# Patient Record
Sex: Female | Born: 1970 | Race: White | Hispanic: No | Marital: Married | Smoking: Current every day smoker
Health system: Southern US, Community
[De-identification: ages and names within clinical notes are randomized; demographics above are authoritative.]

## PROBLEM LIST (undated history)

## (undated) DIAGNOSIS — R87619 Unspecified abnormal cytological findings in specimens from cervix uteri: Secondary | ICD-10-CM

## (undated) HISTORY — PX: CERVICAL BIOPSY  W/ LOOP ELECTRODE EXCISION: SUR135

## (undated) HISTORY — PX: DILATION AND CURETTAGE OF UTERUS: SHX78

## (undated) HISTORY — DX: Unspecified abnormal cytological findings in specimens from cervix uteri: R87.619

## (undated) HISTORY — PX: MOHS SURGERY: SUR867

## (undated) HISTORY — PX: KNEE ARTHROSCOPY: SUR90

## (undated) HISTORY — PX: CRYOTHERAPY: SHX1416

---

## 2005-10-25 ENCOUNTER — Ambulatory Visit: Payer: Self-pay | Admitting: Obstetrics & Gynecology

## 2006-07-04 ENCOUNTER — Observation Stay: Payer: Self-pay | Admitting: Obstetrics & Gynecology

## 2008-03-05 HISTORY — PX: BREAST BIOPSY: SHX20

## 2008-07-15 ENCOUNTER — Inpatient Hospital Stay: Payer: Self-pay

## 2011-04-12 ENCOUNTER — Emergency Department: Payer: Self-pay | Admitting: Emergency Medicine

## 2015-09-15 DIAGNOSIS — D0359 Melanoma in situ of other part of trunk: Secondary | ICD-10-CM | POA: Insufficient documentation

## 2017-01-31 ENCOUNTER — Encounter: Payer: Self-pay | Admitting: Advanced Practice Midwife

## 2017-01-31 ENCOUNTER — Ambulatory Visit (INDEPENDENT_AMBULATORY_CARE_PROVIDER_SITE_OTHER): Payer: BLUE CROSS/BLUE SHIELD | Admitting: Advanced Practice Midwife

## 2017-01-31 VITALS — BP 124/74 | Ht 71.0 in | Wt 220.0 lb

## 2017-01-31 DIAGNOSIS — Z124 Encounter for screening for malignant neoplasm of cervix: Secondary | ICD-10-CM | POA: Diagnosis not present

## 2017-01-31 DIAGNOSIS — M2390 Unspecified internal derangement of unspecified knee: Secondary | ICD-10-CM | POA: Insufficient documentation

## 2017-01-31 DIAGNOSIS — Z01419 Encounter for gynecological examination (general) (routine) without abnormal findings: Secondary | ICD-10-CM | POA: Diagnosis not present

## 2017-01-31 DIAGNOSIS — S8390XA Sprain of unspecified site of unspecified knee, initial encounter: Secondary | ICD-10-CM | POA: Insufficient documentation

## 2017-01-31 NOTE — Progress Notes (Addendum)
Patient ID: Sandra Harper, female   DOB: 23-Aug-1970, 46 y.o.   MRN: [id number]    Gynecology Annual Exam  PCP: Patient, No Pcp Per  Chief Complaint:  Chief Complaint  Patient presents with  . Annual Exam    History of Present Illness: Patient is a 46 y.o. [id number] presents for annual exam. The patient has no complaints today. She says it has been about a year since she has had a period. Discussion of normal menopausal changes. 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  LMP: No LMP recorded.  Postcoital Bleeding: no Dysmenorrhea: not applicable   The patient is sexually active. She currently uses vasectomy for contraception. She denies dyspareunia.  The patient does perform self breast exams.  There is no notable family history of breast or ovarian cancer in her family.  The patient wears seatbelts: yes.   The patient has regular exercise: yes.  She admits to eating healthy and being active but admits there is room for improvement. She has gained weight in the past year. She declines thyroid testing.  The patient denies current symptoms of depression.    Review of Systems: Review of Systems  Constitutional: Negative.   HENT: Negative.   Eyes: Negative.   Respiratory: Negative.   Cardiovascular: Negative.   Gastrointestinal: Negative.   Genitourinary: Negative.   Musculoskeletal: Negative.   Skin: Negative.   Neurological: Negative.   Endo/Heme/Allergies: Negative.   Psychiatric/Behavioral: Negative.     Past Medical History:  Past Medical History:  Diagnosis Date  . Abnormal Pap smear of cervix     Past Surgical History:  Past Surgical History:  Procedure Laterality Date  . CERVICAL BIOPSY  W/ LOOP ELECTRODE EXCISION    . CESAREAN SECTION    . CRYOTHERAPY    . DILATION AND CURETTAGE OF UTERUS      Gynecologic History:  No LMP recorded. Contraception: vasectomy Last Pap: 3 years ago Results were:  no  abnormalities  Last mammogram: 3 years ago Results were: BI-RAD I Obstetric History: [id number]  Family History:  History reviewed. No pertinent family history.  Social History:  Social History   Socioeconomic History  . Marital status: Married    Spouse name: Not on file  . Number of children: Not on file  . Years of education: Not on file  . Highest education level: Not on file  Social Needs  . Financial resource strain: Not on file  . Food insecurity - worry: Not on file  . Food insecurity - inability: Not on file  . Transportation needs - medical: Not on file  . Transportation needs - non-medical: Not on file  Occupational History  . Not on file  Tobacco Use  . Smoking status: Never Smoker  . Smokeless tobacco: Never Used  Substance and Sexual Activity  . Alcohol use: No    Frequency: Never  . Drug use: No  . Sexual activity: Yes    Birth control/protection: Surgical  Other Topics Concern  . Not on file  Social History Narrative  . Not on file    Allergies:  No Known Allergies  Medications: Prior to Admission medications   Not on File    Physical Exam Vitals: Blood pressure 124/74, height 5\' 11"  (1.803 m), weight 220 lb (99.8 kg).  General: NAD HEENT: normocephalic, anicteric Thyroid: no enlargement, no palpable nodules Pulmonary: No increased work of breathing, CTAB Cardiovascular: RRR, distal pulses 2+ Breast:  Breast symmetrical, no tenderness, no palpable nodules or masses, no skin or nipple retraction present, no nipple discharge.  No axillary or supraclavicular lymphadenopathy. Abdomen: NABS, soft, non-tender, non-distended.  Umbilicus without lesions.  No hepatomegaly, splenomegaly or masses palpable. No evidence of hernia  Genitourinary:  External: Normal external female genitalia.  Normal urethral meatus, normal  Bartholin's and Skene's glands.    Vagina: Normal vaginal mucosa, no evidence of prolapse.    Cervix: Grossly normal in appearance, no  bleeding  Uterus: Non-enlarged, mobile, normal contour.  No CMT  Adnexa: ovaries non-enlarged, no adnexal masses  Rectal: deferred  Lymphatic: no evidence of inguinal lymphadenopathy Extremities: no edema, erythema, or tenderness Neurologic: Grossly intact Psychiatric: mood appropriate, affect full    Assessment: 46 y.o. G2P2002 routine annual exam  Plan: Problem List Items Addressed This Visit    None    Visit Diagnoses    Well woman exam with routine gynecological exam    -  Primary   Relevant Orders   IGP, Aptima HPV   MM DIGITAL SCREENING BILATERAL   Ambulatory referral to Gastroenterology   Cervical cancer screening       Relevant Orders   IGP, Aptima HPV      1) Mammogram - recommend yearly screening mammogram.  Mammogram Was ordered today   2) STI screening was offered and declined  3) ASCCP guidelines and rational discussed.  Patient opts for every 3 years screening interval  4) Contraception - vasectomy  5) Colonoscopy: referral sent today-- Screening recommended starting at age 96 for average risk individuals, age 62 for individuals deemed at increased risk (including African Americans) and recommended to continue until age 73.  For patient age 73-85 individualized approach is recommended.  Gold standard screening is via colonoscopy, Cologuard screening is an acceptable alternative for patient unwilling or unable to undergo colonoscopy.  "Colorectal cancer screening for average?risk adults: 2018 guideline update from the [place]: A Cancer Journal for Clinicians: Aug 01, 2016   6) Routine healthcare maintenance including cholesterol, diabetes screening discussed Declines   7) Increase healthy lifestyle diet, exercise  8) Return to clinic in 1 year for annual exam  Rod Can, CNM

## 2017-01-31 NOTE — Patient Instructions (Signed)
Health Maintenance, Female Adopting a healthy lifestyle and getting preventive care can go a long way to promote health and wellness. Talk with your health care provider about what schedule of regular examinations is right for you. This is a good chance for you to check in with your provider about disease prevention and staying healthy. In between checkups, there are plenty of things you can do on your own. Experts have done a lot of research about which lifestyle changes and preventive measures are most likely to keep you healthy. Ask your health care provider for more information. Weight and diet Eat a healthy diet  Be sure to include plenty of vegetables, fruits, low-fat dairy products, and lean protein.  Do not eat a lot of foods high in solid fats, added sugars, or salt.  Get regular exercise. This is one of the most important things you can do for your health. ? Most adults should exercise for at least 150 minutes each week. The exercise should increase your heart rate and make you sweat (moderate-intensity exercise). ? Most adults should also do strengthening exercises at least twice a week. This is in addition to the moderate-intensity exercise.  Maintain a healthy weight  Body mass index (BMI) is a measurement that can be used to identify possible weight problems. It estimates body fat based on height and weight. Your health care provider can help determine your BMI and help you achieve or maintain a healthy weight.  For females 69 years of age and older: ? A BMI below 18.5 is considered underweight. ? A BMI of 18.5 to 24.9 is normal. ? A BMI of 25 to 29.9 is considered overweight. ? A BMI of 30 and above is considered obese.  Watch levels of cholesterol and blood lipids  You should start having your blood tested for lipids and cholesterol at 48 years of age, then have this test every 5 years.  You may need to have your cholesterol levels checked more often if: ? Your lipid or  cholesterol levels are high. ? You are older than 48 years of age. ? You are at high risk for heart disease.  Cancer screening Lung Cancer  Lung cancer screening is recommended for adults 70-27 years old who are at high risk for lung cancer because of a history of smoking.  A yearly low-dose CT scan of the lungs is recommended for people who: ? Currently smoke. ? Have quit within the past 15 years. ? Have at least a 30-pack-year history of smoking. A pack year is smoking an average of one pack of cigarettes a day for 1 year.  Yearly screening should continue until it has been 15 years since you quit.  Yearly screening should stop if you develop a health problem that would prevent you from having lung cancer treatment.  Breast Cancer  Practice breast self-awareness. This means understanding how your breasts normally appear and feel.  It also means doing regular breast self-exams. Let your health care provider know about any changes, no matter how small.  If you are in your 20s or 30s, you should have a clinical breast exam (CBE) by a health care provider every 1-3 years as part of a regular health exam.  If you are 68 or older, have a CBE every year. Also consider having a breast X-ray (mammogram) every year.  If you have a family history of breast cancer, talk to your health care provider about genetic screening.  If you are at high risk  for breast cancer, talk to your health care provider about having an MRI and a mammogram every year.  Breast cancer gene (BRCA) assessment is recommended for women who have family members with BRCA-related cancers. BRCA-related cancers include: ? Breast. ? Ovarian. ? Tubal. ? Peritoneal cancers.  Results of the assessment will determine the need for genetic counseling and BRCA1 and BRCA2 testing.  Cervical Cancer Your health care provider may recommend that you be screened regularly for cancer of the pelvic organs (ovaries, uterus, and  vagina). This screening involves a pelvic examination, including checking for microscopic changes to the surface of your cervix (Pap test). You may be encouraged to have this screening done every 3 years, beginning at age 22.  For women ages 56-65, health care providers may recommend pelvic exams and Pap testing every 3 years, or they may recommend the Pap and pelvic exam, combined with testing for human papilloma virus (HPV), every 5 years. Some types of HPV increase your risk of cervical cancer. Testing for HPV may also be done on women of any age with unclear Pap test results.  Other health care providers may not recommend any screening for nonpregnant women who are considered low risk for pelvic cancer and who do not have symptoms. Ask your health care provider if a screening pelvic exam is right for you.  If you have had past treatment for cervical cancer or a condition that could lead to cancer, you need Pap tests and screening for cancer for at least 20 years after your treatment. If Pap tests have been discontinued, your risk factors (such as having a new sexual partner) need to be reassessed to determine if screening should resume. Some women have medical problems that increase the chance of getting cervical cancer. In these cases, your health care provider may recommend more frequent screening and Pap tests.  Colorectal Cancer  This type of cancer can be detected and often prevented.  Routine colorectal cancer screening usually begins at 48 years of age and continues through 48 years of age.  Your health care provider may recommend screening at an earlier age if you have risk factors for colon cancer.  Your health care provider may also recommend using home test kits to check for hidden blood in the stool.  A small camera at the end of a tube can be used to examine your colon directly (sigmoidoscopy or colonoscopy). This is done to check for the earliest forms of colorectal  cancer.  Routine screening usually begins at age 33.  Direct examination of the colon should be repeated every 5-10 years through 48 years of age. However, you may need to be screened more often if early forms of precancerous polyps or small growths are found.  Skin Cancer  Check your skin from head to toe regularly.  Tell your health care provider about any new moles or changes in moles, especially if there is a change in a mole's shape or color.  Also tell your health care provider if you have a mole that is larger than the size of a pencil eraser.  Always use sunscreen. Apply sunscreen liberally and repeatedly throughout the day.  Protect yourself by wearing long sleeves, pants, a wide-brimmed hat, and sunglasses whenever you are outside.  Heart disease, diabetes, and high blood pressure  High blood pressure causes heart disease and increases the risk of stroke. High blood pressure is more likely to develop in: ? People who have blood pressure in the high end of  the normal range (130-139/85-89 mm Hg). ? People who are overweight or obese. ? People who are African American.  If you are 21-29 years of age, have your blood pressure checked every 3-5 years. If you are 3 years of age or older, have your blood pressure checked every year. You should have your blood pressure measured twice-once when you are at a hospital or clinic, and once when you are not at a hospital or clinic. Record the average of the two measurements. To check your blood pressure when you are not at a hospital or clinic, you can use: ? An automated blood pressure machine at a pharmacy. ? A home blood pressure monitor.  If you are between 17 years and 37 years old, ask your health care provider if you should take aspirin to prevent strokes.  Have regular diabetes screenings. This involves taking a blood sample to check your fasting blood sugar level. ? If you are at a normal weight and have a low risk for diabetes,  have this test once every three years after 48 years of age. ? If you are overweight and have a high risk for diabetes, consider being tested at a younger age or more often. Preventing infection Hepatitis B  If you have a higher risk for hepatitis B, you should be screened for this virus. You are considered at high risk for hepatitis B if: ? You were born in a country where hepatitis B is common. Ask your health care provider which countries are considered high risk. ? Your parents were born in a high-risk country, and you have not been immunized against hepatitis B (hepatitis B vaccine). ? You have HIV or AIDS. ? You use needles to inject street drugs. ? You live with someone who has hepatitis B. ? You have had sex with someone who has hepatitis B. ? You get hemodialysis treatment. ? You take certain medicines for conditions, including cancer, organ transplantation, and autoimmune conditions.  Hepatitis C  Blood testing is recommended for: ? Everyone born from [birth year] through 1967. ? Anyone with known risk factors for hepatitis C.  Sexually transmitted infections (STIs)  You should be screened for sexually transmitted infections (STIs) including gonorrhea and chlamydia if: ? You are sexually active and are younger than 48 years of age. ? You are older than 48 years of age and your health care provider tells you that you are at risk for this type of infection. ? Your sexual activity has changed since you were last screened and you are at an increased risk for chlamydia or gonorrhea. Ask your health care provider if you are at risk.  If you do not have HIV, but are at risk, it may be recommended that you take a prescription medicine daily to prevent HIV infection. This is called pre-exposure prophylaxis (PrEP). You are considered at risk if: ? You are sexually active and do not regularly use condoms or know the HIV status of your partner(s). ? You take drugs by injection. ? You are  sexually active with a partner who has HIV.  Talk with your health care provider about whether you are at high risk of being infected with HIV. If you choose to begin PrEP, you should first be tested for HIV. You should then be tested every 3 months for as long as you are taking PrEP. Pregnancy  If you are premenopausal and you may become pregnant, ask your health care provider about preconception counseling.  If you may become  pregnant, take 400 to 800 micrograms (mcg) of folic acid every day.  If you want to prevent pregnancy, talk to your health care provider about birth control (contraception). Osteoporosis and menopause  Osteoporosis is a disease in which the bones lose minerals and strength with aging. This can result in serious bone fractures. Your risk for osteoporosis can be identified using a bone density scan.  If you are 51 years of age or older, or if you are at risk for osteoporosis and fractures, ask your health care provider if you should be screened.  Ask your health care provider whether you should take a calcium or vitamin D supplement to lower your risk for osteoporosis.  Menopause may have certain physical symptoms and risks.  Hormone replacement therapy may reduce some of these symptoms and risks. Talk to your health care provider about whether hormone replacement therapy is right for you. Follow these instructions at home:  Schedule regular health, dental, and eye exams.  Stay current with your immunizations.  Do not use any tobacco products including cigarettes, chewing tobacco, or electronic cigarettes.  If you are pregnant, do not drink alcohol.  If you are breastfeeding, limit how much and how often you drink alcohol.  Limit alcohol intake to no more than 1 drink per day for nonpregnant women. One drink equals 12 ounces of beer, 5 ounces of wine, or 1 ounces of hard liquor.  Do not use street drugs.  Do not share needles.  Ask your health care  provider for help if you need support or information about quitting drugs.  Tell your health care provider if you often feel depressed.  Tell your health care provider if you have ever been abused or do not feel safe at home. This information is not intended to replace advice given to you by your health care provider. Make sure you discuss any questions you have with your health care provider. Document Released: 09/04/2010 Document Revised: 07/28/2015 Document Reviewed: 11/23/2014 Elsevier Interactive Patient Education  2018 [employer].   Colonoscopy, Adult A colonoscopy is an exam to look at the entire large intestine. During the exam, a lubricated, bendable tube is inserted into the anus and then passed into the rectum, colon, and other parts of the large intestine. A colonoscopy is often done as a part of normal colorectal screening or in response to certain symptoms, such as anemia, persistent diarrhea, abdominal pain, and blood in the stool. The exam can help screen for and diagnose medical problems, including:  Tumors.  Polyps.  Inflammation.  Areas of bleeding.  Tell a health care provider about:  Any allergies you have.  All medicines you are taking, including vitamins, herbs, eye drops, creams, and over-the-counter medicines.  Any problems you or family members have had with anesthetic medicines.  Any blood disorders you have.  Any surgeries you have had.  Any medical conditions you have.  Any problems you have had passing stool. What are the risks? Generally, this is a safe procedure. However, problems may occur, including:  Bleeding.  A tear in the intestine.  A reaction to medicines given during the exam.  Infection (rare).  What happens before the procedure? Eating and drinking restrictions Follow instructions from your health care provider about eating and drinking, which may include:  A few days before the procedure - follow a low-fiber diet.  Avoid nuts, seeds, dried fruit, raw fruits, and vegetables.  1-3 days before the procedure - follow a clear liquid diet. Drink only  clear liquids, such as clear broth or bouillon, black coffee or tea, clear juice, clear soft drinks or sports drinks, gelatin dessert, and popsicles. Avoid any liquids that contain red or purple dye.  On the day of the procedure - do not eat or drink anything during the 2 hours before the procedure, or within the time period that your health care provider recommends.  Bowel prep If you were prescribed an oral bowel prep to clean out your colon:  Take it as told by your health care provider. Starting the day before your procedure, you will need to drink a large amount of medicated liquid. The liquid will cause you to have multiple loose stools until your stool is almost clear or light green.  If your skin or anus gets irritated from diarrhea, you may use these to relieve the irritation: ? Medicated wipes, such as adult wet wipes with aloe and vitamin E. ? A skin soothing-product like petroleum jelly.  If you vomit while drinking the bowel prep, take a break for up to 60 minutes and then begin the bowel prep again. If vomiting continues and you cannot take the bowel prep without vomiting, call your health care provider.  General instructions  Ask your health care provider about changing or stopping your regular medicines. This is especially important if you are taking diabetes medicines or blood thinners.  Plan to have someone take you home from the hospital or clinic. What happens during the procedure?  An IV tube may be inserted into one of your veins.  You will be given medicine to help you relax (sedative).  To reduce your risk of infection: ? Your health care team will wash or sanitize their hands. ? Your anal area will be washed with soap.  You will be asked to lie on your side with your knees bent.  Your health care provider will lubricate a long,  thin, flexible tube. The tube will have a camera and a light on the end.  The tube will be inserted into your anus.  The tube will be gently eased through your rectum and colon.  Air will be delivered into your colon to keep it open. You may feel some pressure or cramping.  The camera will be used to take images during the procedure.  A small tissue sample may be removed from your body to be examined under a microscope (biopsy). If any potential problems are found, the tissue will be sent to a lab for testing.  If small polyps are found, your health care provider may remove them and have them checked for cancer cells.  The tube that was inserted into your anus will be slowly removed. The procedure may vary among health care providers and hospitals. What happens after the procedure?  Your blood pressure, heart rate, breathing rate, and blood oxygen level will be monitored until the medicines you were given have worn off.  Do not drive for 24 hours after the exam.  You may have a small amount of blood in your stool.  You may pass gas and have mild abdominal cramping or bloating due to the air that was used to inflate your colon during the exam.  It is up to you to get the results of your procedure. Ask your health care provider, or the department performing the procedure, when your results will be ready. This information is not intended to replace advice given to you by your health care provider. Make sure you discuss any questions  you have with your health care provider. Document Released: 02/17/2000 Document Revised: 12/21/2015 Document Reviewed: 05/03/2015 Elsevier Interactive Patient Education  2018 [employer].     Why follow it? Research shows. . Those who follow the Mediterranean diet have a reduced risk of heart disease  . The diet is associated with a reduced incidence of Parkinson's and Alzheimer's diseases . People following the diet may have longer life expectancies and  lower rates of chronic diseases  . The Dietary Guidelines for Americans recommends the Mediterranean diet as an eating plan to promote health and prevent disease  What Is the Mediterranean Diet?  . Healthy eating plan based on typical foods and recipes of Mediterranean-style cooking . The diet is primarily a plant based diet; these foods should make up a majority of meals   Starches - Plant based foods should make up a majority of meals - They are an important sources of vitamins, minerals, energy, antioxidants, and fiber - Choose whole grains, foods high in fiber and minimally processed items  - Typical grain sources include wheat, oats, barley, corn, brown rice, bulgar, farro, millet, polenta, couscous  - Various types of beans include chickpeas, lentils, fava beans, black beans, white beans   Fruits  Veggies - Large quantities of antioxidant rich fruits & veggies; 6 or more servings  - Vegetables can be eaten raw or lightly drizzled with oil and cooked  - Vegetables common to the traditional Mediterranean Diet include: artichokes, arugula, beets, broccoli, brussel sprouts, cabbage, carrots, celery, collard greens, cucumbers, eggplant, kale, leeks, lemons, lettuce, mushrooms, okra, onions, peas, peppers, potatoes, pumpkin, radishes, rutabaga, shallots, spinach, sweet potatoes, turnips, zucchini - Fruits common to the Mediterranean Diet include: apples, apricots, avocados, cherries, clementines, dates, figs, grapefruits, grapes, melons, nectarines, oranges, peaches, pears, pomegranates, strawberries, tangerines  Fats - Replace butter and margarine with healthy oils, such as olive oil, canola oil, and tahini  - Limit nuts to no more than a handful a day  - Nuts include walnuts, almonds, pecans, pistachios, pine nuts  - Limit or avoid candied, honey roasted or heavily salted nuts - Olives are central to the [employer] - can be eaten whole or used in a variety of dishes   Meats Protein -  Limiting red meat: no more than a few times a month - When eating red meat: choose lean cuts and keep the portion to the size of deck of cards - Eggs: approx. 0 to 4 times a week  - Fish and lean poultry: at least 2 a week  - Healthy protein sources include, chicken, [place], lean beef, lamb - Increase intake of seafood such as tuna, salmon, trout, mackerel, shrimp, scallops - Avoid or limit high fat processed meats such as sausage and bacon  Dairy - Include moderate amounts of low fat dairy products  - Focus on healthy dairy such as fat free yogurt, skim milk, low or reduced fat cheese - Limit dairy products higher in fat such as whole or 2% milk, cheese, ice cream  Alcohol - Moderate amounts of red wine is ok  - No more than 5 oz daily for women (all ages) and men older than age 58  - No more than 10 oz of wine daily for men younger than 41  Other - Limit sweets and other desserts  - Use herbs and spices instead of salt to flavor foods  - Herbs and spices common to the traditional Mediterranean Diet include: basil, bay leaves, chives, cloves, cumin, fennel,  garlic, lavender, marjoram, mint, oregano, parsley, pepper, rosemary, sage, savory, sumac, tarragon, thyme   It's not just a diet, it's a lifestyle:  . The Mediterranean diet includes lifestyle factors typical of those in the region  . Foods, drinks and meals are best eaten with others and savored . Daily physical activity is important for overall good health . This could be strenuous exercise like running and aerobics . This could also be more leisurely activities such as walking, housework, yard-work, or taking the stairs . Moderation is the key; a balanced and healthy diet accommodates most foods and drinks . Consider portion sizes and frequency of consumption of certain foods   Meal Ideas & Options:  . Breakfast:  o Whole wheat toast or whole wheat English muffins with peanut butter & hard boiled egg o Steel cut oats topped with  apples & cinnamon and skim milk  o Fresh fruit: banana, strawberries, melon, berries, peaches  o Smoothies: strawberries, bananas, greek yogurt, peanut butter o Low fat greek yogurt with blueberries and granola  o Egg white omelet with spinach and mushrooms o Breakfast couscous: whole wheat couscous, apricots, skim milk, cranberries  . Sandwiches:  o Hummus and grilled vegetables (peppers, zucchini, squash) on whole wheat bread   o Grilled chicken on whole wheat pita with lettuce, tomatoes, cucumbers or tzatziki  o Tuna salad on whole wheat bread: tuna salad made with greek yogurt, olives, red peppers, capers, green onions o Garlic rosemary lamb pita: lamb sauted with garlic, rosemary, salt & pepper; add lettuce, cucumber, greek yogurt to pita - flavor with lemon juice and black pepper  . Seafood:  o Mediterranean grilled salmon, seasoned with garlic, basil, parsley, lemon juice and black pepper o Shrimp, lemon, and spinach whole-grain pasta salad made with low fat greek yogurt  o Seared scallops with lemon orzo  o Seared tuna steaks seasoned salt, pepper, coriander topped with tomato mixture of olives, tomatoes, olive oil, minced garlic, parsley, green onions and cappers  . Meats:  o Herbed greek chicken salad with kalamata olives, cucumber, feta  o Red bell peppers stuffed with spinach, bulgur, lean ground beef (or lentils) & topped with feta   o Kebabs: skewers of chicken, tomatoes, onions, zucchini, squash  o Turkey burgers: made with red onions, mint, dill, lemon juice, feta cheese topped with roasted red peppers . Vegetarian o Cucumber salad: cucumbers, artichoke hearts, celery, red onion, feta cheese, tossed in olive oil & lemon juice  o Hummus and whole grain pita points with a greek salad (lettuce, tomato, feta, olives, cucumbers, red onion) o Lentil soup with celery, carrots made with vegetable broth, garlic, salt and pepper  o Tabouli salad: parsley, bulgur, mint, scallions,  cucumbers, tomato, radishes, lemon juice, olive oil, salt and pepper.      American Heart Association (AHA) Exercise Recommendation  Being physically active is important to prevent heart disease and stroke, the nation's No. 1and No. 5killers. To improve overall cardiovascular health, we suggest at least 150 minutes per week of moderate exercise or 75 minutes per week of vigorous exercise (or a combination of moderate and vigorous activity). Thirty minutes a day, five times a week is an easy goal to remember. You will also experience benefits even if you divide your time into two or three segments of 10 to 15 minutes per day.  For people who would benefit from lowering their blood pressure or cholesterol, we recommend 40 minutes of aerobic exercise of moderate to vigorous intensity three to   four times a week to lower the risk for heart attack and stroke.  Physical activity is anything that makes you move your body and burn calories.  This includes things like climbing stairs or playing sports. Aerobic exercises benefit your heart, and include walking, jogging, swimming or biking. Strength and stretching exercises are best for overall stamina and flexibility.  The simplest, positive change you can make to effectively improve your heart health is to start walking. It's enjoyable, free, easy, social and great exercise. A walking program is flexible and boasts high success rates because people can stick with it. It's easy for walking to become a regular and satisfying part of life.   For Overall Cardiovascular Health:  At least 30 minutes of moderate-intensity aerobic activity at least 5 days per week for a total of 150  OR   At least 25 minutes of vigorous aerobic activity at least 3 days per week for a total of 75 minutes; or a combination of moderate- and vigorous-intensity aerobic activity  AND   Moderate- to high-intensity muscle-strengthening activity at least 2 days per week for  additional health benefits.  For Lowering Blood Pressure and Cholesterol  An average 40 minutes of moderate- to vigorous-intensity aerobic activity 3 or 4 times per week  What if I can't make it to the time goal? Something is always better than nothing! And everyone has to start somewhere. Even if you've been sedentary for years, today is the day you can begin to make healthy changes in your life. If you don't think you'll make it for 30 or 40 minutes, set a reachable goal for today. You can work up toward your overall goal by increasing your time as you get stronger. Don't let all-or-nothing thinking rob you of doing what you can every day.  Source:http://www.heart.org

## 2017-02-04 LAB — IGP, APTIMA HPV
HPV APTIMA: NEGATIVE
PAP Smear Comment: 0

## 2017-02-05 ENCOUNTER — Telehealth: Payer: Self-pay | Admitting: Gastroenterology

## 2017-02-05 NOTE — Telephone Encounter (Signed)
Patient Sandra Harper and would like to schedule a colonoscopy on 12/14.

## 2017-02-06 NOTE — Telephone Encounter (Signed)
LVM for pt to return my call.

## 2017-02-07 ENCOUNTER — Telehealth: Payer: Self-pay | Admitting: Gastroenterology

## 2017-02-07 NOTE — Telephone Encounter (Signed)
Patient left a voice message that she wanted that appt on the 14th. She will be in [place] all day but call her tomorrow

## 2017-02-13 ENCOUNTER — Telehealth: Payer: Self-pay | Admitting: Gastroenterology

## 2017-02-13 ENCOUNTER — Other Ambulatory Visit: Payer: Self-pay

## 2017-02-13 DIAGNOSIS — Z1211 Encounter for screening for malignant neoplasm of colon: Secondary | ICD-10-CM

## 2017-02-13 MED ORDER — PEG 3350-KCL-NA BICARB-NACL 420 G PO SOLR: 4000.0000 mL | Freq: Once | ORAL | 0 refills | Status: AC

## 2017-02-13 NOTE — Telephone Encounter (Signed)
Patient left a voice message that you called and she hasn't heard back from you. She wants her colonoscopy this Friday at [facility]. Please call her today.

## 2017-02-15 ENCOUNTER — Ambulatory Visit
Admission: RE | Admit: 2017-02-15 | Discharge: 2017-02-15 | Disposition: A | Discharge status: Home / Self Care | Payer: BLUE CROSS/BLUE SHIELD | Source: Ambulatory Visit | Attending: Gastroenterology | Admitting: Gastroenterology

## 2017-02-15 ENCOUNTER — Encounter: Payer: Self-pay | Admitting: *Deleted

## 2017-02-15 ENCOUNTER — Ambulatory Visit: Payer: BLUE CROSS/BLUE SHIELD | Admitting: Certified Registered Nurse Anesthetist

## 2017-02-15 ENCOUNTER — Encounter
Admission: RE | Disposition: A | Discharge status: Home / Self Care | Payer: Self-pay | Source: Ambulatory Visit | Attending: Gastroenterology

## 2017-02-15 DIAGNOSIS — K635 Polyp of colon: Secondary | ICD-10-CM | POA: Insufficient documentation

## 2017-02-15 DIAGNOSIS — Z1211 Encounter for screening for malignant neoplasm of colon: Secondary | ICD-10-CM

## 2017-02-15 DIAGNOSIS — K5730 Diverticulosis of large intestine without perforation or abscess without bleeding: Secondary | ICD-10-CM | POA: Diagnosis not present

## 2017-02-15 DIAGNOSIS — D124 Benign neoplasm of descending colon: Secondary | ICD-10-CM

## 2017-02-15 DIAGNOSIS — K3189 Other diseases of stomach and duodenum: Secondary | ICD-10-CM | POA: Diagnosis not present

## 2017-02-15 HISTORY — PX: COLONOSCOPY WITH PROPOFOL: SHX5780

## 2017-02-15 LAB — POCT PREGNANCY, URINE: PREG TEST UR: NEGATIVE

## 2017-02-15 SURGERY — COLONOSCOPY WITH PROPOFOL
Anesthesia: General

## 2017-02-15 MED ORDER — PROPOFOL 500 MG/50ML IV EMUL
INTRAVENOUS | Status: AC
  Filled 2017-02-15: qty 50

## 2017-02-15 MED ORDER — PROPOFOL 10 MG/ML IV BOLUS
INTRAVENOUS | Status: DC | PRN
  Administered 2017-02-15 (×2): 20 mg via INTRAVENOUS
  Administered 2017-02-15: 60 mg via INTRAVENOUS
  Administered 2017-02-15: 20 mg via INTRAVENOUS

## 2017-02-15 MED ORDER — PROPOFOL 500 MG/50ML IV EMUL
INTRAVENOUS | Status: DC | PRN
  Administered 2017-02-15: 140 ug/kg/min via INTRAVENOUS

## 2017-02-15 MED ORDER — SODIUM CHLORIDE 0.9 % IV SOLN
INTRAVENOUS | Status: DC
  Administered 2017-02-15: 13:00:00 via INTRAVENOUS

## 2017-02-15 NOTE — Op Note (Addendum)
[facility]astroenterology Patient Name: Sandra Harper Procedure Date: 02/15/2017 1:15 PM MRN: [id number] Account #: [id number] Date of Birth: March 14, 1970 Admit Type: Outpatient Age: 46 Room: [facility] ENDO ROOM 4 Gender: Female Note Status: Finalized Procedure:            Colonoscopy Indications:          Screening for colorectal malignant neoplasm Providers:            Varnita B. Bonna Gains MD, MD Referring MD:         Forest Gleason Md, MD (Referring MD) Medicines:            Monitored Anesthesia Care Complications:        No immediate complications. Procedure:            Pre-Anesthesia Assessment:                       - ASA Grade Assessment: I - A normal, healthy patient.                       - Prior to the procedure, a History and Physical was                        performed, and patient medications, allergies and                        sensitivities were reviewed. The patient's tolerance of                        previous anesthesia was reviewed.                       - The risks and benefits of the procedure and the                        sedation options and risks were discussed with the                        patient. All questions were answered and informed                        consent was obtained.                       - Patient identification and proposed procedure were                        verified prior to the procedure by the physician, the                        nurse, the anesthesiologist, the anesthetist and the                        technician. The procedure was verified in the procedure                        room.                       After obtaining informed consent, the colonoscope was  passed under direct vision. Throughout the procedure,                        the patient's blood pressure, pulse, and oxygen                        saturations were monitored continuously. The                        Colonoscope was  introduced through the anus and                        advanced to the the cecum, identified by appendiceal                        orifice and ileocecal valve. The colonoscopy was                        performed with ease. The patient tolerated the                        procedure well. The quality of the bowel preparation                        was fair. Findings:      The perianal and digital rectal examinations were normal.      Two sessile polyps were found in the sigmoid colon and descending colon.       The polyps were 3 to 4 mm in size. These polyps were removed with a cold       biopsy forceps. Resection and retrieval were complete.      Multiple small and large-mouthed diverticula were found in the sigmoid       colon.      A localized area of mildly erythematous mucosa was found in the sigmoid       colon. 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 Large lesions were ruled       out. Small or flat lesions could have been missed due to the prep.      The exam was otherwise without abnormality.      The retroflexed view of the distal rectum and anal verge was normal and       showed no anal or rectal abnormalities. Impression:           - Preparation of the colon was fair.                       - Two 3 to 4 mm polyps in the sigmoid colon and in the                        descending colon, removed with a cold biopsy forceps.                        Resected and retrieved.                       - Diverticulosis in the sigmoid colon.                       -  Erythematous mucosa in the sigmoid colon.                       - The examination was otherwise normal.                       - The distal rectum and anal verge are normal on                        retroflexion view. Recommendation:       - Discharge patient to home (with escort).                       - Advance diet as tolerated.                        - Continue present medications.                       - Await pathology results.                       - Repeat colonoscopy in 2 years for surveillance with 2                        day prep due to fair prep on this exam.                       - The findings and recommendations were discussed with                        the patient.                       - The findings and recommendations were discussed with                        the patient's family.                       - Return to primary care physician as previously                        scheduled.                       - High fiber diet. Procedure Code(s):    --- Professional ---                       [phone], Colonoscopy, flexible; with biopsy, single or                        multiple Diagnosis Code(s):    --- Professional ---                       Z12.11, Encounter for screening for malignant neoplasm                        of colon                       D12.5, Benign neoplasm of sigmoid colon  D12.4, Benign neoplasm of descending colon                       K63.89, Other specified diseases of intestine                       K57.30, Diverticulosis of large intestine without                        perforation or abscess without bleeding CPT copyright 2016 American Medical Association. All rights reserved. The codes documented in this report are preliminary and upon coder review may  be revised to meet current compliance requirements.  Vonda Antigua, MD Margretta Sidle B. Bonna Gains MD, MD 02/15/2017 2:06:43 PM This report has been signed electronically. Number of Addenda: 0 Note Initiated On: 02/15/2017 1:15 PM Scope Withdrawal Time: 0 hours 20 minutes 29 seconds  Total Procedure Duration: 0 hours 34 minutes 58 seconds  Estimated Blood Loss: Estimated blood loss: none.      [facility]

## 2017-02-15 NOTE — Anesthesia Preprocedure Evaluation (Signed)
Anesthesia Evaluation  Patient identified by MRN, date of birth, ID band Patient awake    Reviewed: Allergy & Precautions, NPO status , Patient's Chart, lab work & pertinent test results  History of Anesthesia Complications Negative for: history of anesthetic complications  Airway Mallampati: II  TM Distance: >3 FB Neck ROM: Full    Dental no notable dental hx.    Pulmonary neg pulmonary ROS, neg sleep apnea, neg COPD,    breath sounds clear to auscultation- rhonchi (-) wheezing      Cardiovascular Exercise Tolerance: Good (-) hypertension(-) CAD, (-) Past MI and (-) Cardiac Stents  Rhythm:Regular Rate:Normal - Systolic murmurs and - Diastolic murmurs    Neuro/Psych negative neurological ROS  negative psych ROS   GI/Hepatic negative GI ROS, Neg liver ROS,   Endo/Other  negative endocrine ROSneg diabetes  Renal/GU negative Renal ROS     Musculoskeletal negative musculoskeletal ROS (+)   Abdominal (+) - obese,   Peds  Hematology negative hematology ROS (+)   Anesthesia Other Findings Past Medical History: No date: Abnormal Pap smear of cervix   Reproductive/Obstetrics                             Anesthesia Physical Anesthesia Plan  ASA: I  Anesthesia Plan: General   Post-op Pain Management:    Induction: Intravenous  PONV Risk Score and Plan: 2 and Propofol infusion  Airway Management Planned: Natural Airway  Additional Equipment:   Intra-op Plan:   Post-operative Plan:   Informed Consent: I have reviewed the patients History and Physical, chart, labs and discussed the procedure including the risks, benefits and alternatives for the proposed anesthesia with the patient or authorized representative who has indicated his/her understanding and acceptance.   Dental advisory given  Plan Discussed with: CRNA and Anesthesiologist  Anesthesia Plan Comments:          Anesthesia Quick Evaluation

## 2017-02-15 NOTE — Transfer of Care (Signed)
Immediate Anesthesia Transfer of Care Note  Patient: Sandra Harper  Procedure(s) Performed: COLONOSCOPY WITH PROPOFOL (N/A )  Patient Location: PACU  Anesthesia Type:General  Level of Consciousness: awake, alert  and oriented  Airway & Oxygen Therapy: Patient Spontanous Breathing and Patient connected to nasal cannula oxygen  Post-op Assessment: Report given to RN and Post -op Vital signs reviewed and stable  Post vital signs: Reviewed and stable  Last Vitals:  Vitals:   02/15/17 1238  BP: 122/76  Pulse: 77  Resp: 16  Temp: 36.4 C  SpO2: 100%    Last Pain:  Vitals:   02/15/17 1238  TempSrc: Tympanic         Complications: No apparent anesthesia complications

## 2017-02-15 NOTE — Anesthesia Procedure Notes (Signed)
Performed by: Cobain Morici, CRNA Pre-anesthesia Checklist: Patient identified, Emergency Drugs available, Suction available, Patient being monitored and Timeout performed Patient Re-evaluated:Patient Re-evaluated prior to induction Oxygen Delivery Method: Nasal cannula Induction Type: IV induction       

## 2017-02-15 NOTE — H&P (Signed)
  Vonda Antigua, MD [address], [place], [place], [place], [address] [id number], [place], [place], [place], [address] Phone: [phone]  Fax: [phone]  Primary Care Physician:  Patient, No Pcp Per   Pre-Procedure History & Physical: HPI:  Sandra Harper is a 46 y.o. female is here for an colonoscopy.   Past Medical History:  Diagnosis Date  . Abnormal Pap smear of cervix     Past Surgical History:  Procedure Laterality Date  . CERVICAL BIOPSY  W/ LOOP ELECTRODE EXCISION    . CESAREAN SECTION    . CRYOTHERAPY    . DILATION AND CURETTAGE OF UTERUS    . MOHS SURGERY     laser removal of melanoma right shoulder    Prior to Admission medications   Not on File    Allergies as of 02/14/2017  . (No Known Allergies)    History reviewed. No pertinent family history.  Social History   Socioeconomic History  . Marital status: Married    Spouse name: Not on file  . Number of children: Not on file  . Years of education: Not on file  . Highest education level: Not on file  Social Needs  . Financial resource strain: Not on file  . Food insecurity - worry: Not on file  . Food insecurity - inability: Not on file  . Transportation needs - medical: Not on file  . Transportation needs - non-medical: Not on file  Occupational History  . Not on file  Tobacco Use  . Smoking status: Never Smoker  . Smokeless tobacco: Never Used  Substance and Sexual Activity  . Alcohol use: No    Frequency: Never  . Drug use: No  . Sexual activity: Yes    Birth control/protection: Surgical  Other Topics Concern  . Not on file  Social History Narrative  . Not on file    Review of Systems: See HPI, otherwise negative ROS  Physical Exam: BP 122/76   Pulse 77   Temp 97.6 F (36.4 C) (Tympanic)   Resp 16   Ht 5\' 11"  (1.803 m)   Wt 220 lb (99.8 kg)   LMP 02/16/2016 (Approximate)   SpO2 100%   BMI 30.68 kg/m  General:   Alert,  pleasant and cooperative in NAD Head:   Normocephalic and atraumatic. Neck:  Supple; no masses or thyromegaly. Lungs:  Clear throughout to auscultation, normal respiratory effort.    Heart:  +S1, +S2, Regular rate and rhythm, No edema. Abdomen:  Soft, nontender and nondistended. Normal bowel sounds, without guarding, and without rebound.   Neurologic:  Alert and  oriented x4;  grossly normal neurologically.  Impression/Plan: Sandra Harper is here for an colonoscopy to be performed for surveillance due to [facility] screening  Risks, benefits, limitations, and alternatives regarding  colonoscopy have been reviewed with the patient.  Questions have been answered.  All parties agreeable.   Virgel Manifold, MD  02/15/2017, 1:14 PM

## 2017-02-15 NOTE — Anesthesia Postprocedure Evaluation (Signed)
Anesthesia Post Note  Patient: Sandra Harper  Procedure(s) Performed: COLONOSCOPY WITH PROPOFOL (N/A )  Patient location during evaluation: Endoscopy Anesthesia Type: General Level of consciousness: awake and alert and oriented Pain management: pain level controlled Vital Signs Assessment: post-procedure vital signs reviewed and stable Respiratory status: spontaneous breathing, nonlabored ventilation and respiratory function stable Cardiovascular status: blood pressure returned to baseline and stable Postop Assessment: no signs of nausea or vomiting Anesthetic complications: no     Last Vitals:  Vitals:   02/15/17 1238 02/15/17 1403  BP: 122/76   Pulse: 77   Resp: 16   Temp: 36.4 C (!) 35.8 C  SpO2: 100%     Last Pain:  Vitals:   02/15/17 1403  TempSrc: Tympanic                 Orlo Brickle

## 2017-02-15 NOTE — Anesthesia Post-op Follow-up Note (Signed)
Anesthesia QCDR form completed.        

## 2017-02-18 ENCOUNTER — Encounter: Payer: Self-pay | Admitting: Gastroenterology

## 2017-02-18 LAB — SURGICAL PATHOLOGY

## 2017-02-19 ENCOUNTER — Encounter: Payer: Self-pay | Admitting: Gastroenterology

## 2017-04-13 ENCOUNTER — Emergency Department: Payer: BLUE CROSS/BLUE SHIELD

## 2017-04-13 ENCOUNTER — Encounter: Payer: Self-pay | Admitting: Emergency Medicine

## 2017-04-13 DIAGNOSIS — Y929 Unspecified place or not applicable: Secondary | ICD-10-CM | POA: Diagnosis not present

## 2017-04-13 DIAGNOSIS — S0990XA Unspecified injury of head, initial encounter: Secondary | ICD-10-CM | POA: Diagnosis present

## 2017-04-13 DIAGNOSIS — F17200 Nicotine dependence, unspecified, uncomplicated: Secondary | ICD-10-CM | POA: Diagnosis not present

## 2017-04-13 DIAGNOSIS — Y939 Activity, unspecified: Secondary | ICD-10-CM | POA: Diagnosis not present

## 2017-04-13 DIAGNOSIS — Y998 Other external cause status: Secondary | ICD-10-CM | POA: Insufficient documentation

## 2017-04-13 DIAGNOSIS — S060X0A Concussion without loss of consciousness, initial encounter: Secondary | ICD-10-CM | POA: Diagnosis not present

## 2017-04-13 DIAGNOSIS — W2113XA Struck by golf club, initial encounter: Secondary | ICD-10-CM | POA: Diagnosis not present
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
## 2017-04-14 ENCOUNTER — Emergency Department
Admission: EM | Admit: 2017-04-14 | Discharge: 2017-04-14 | Disposition: A | Discharge status: Home / Self Care | Payer: BLUE CROSS/BLUE SHIELD | Attending: Emergency Medicine | Admitting: Emergency Medicine

## 2017-04-14 DIAGNOSIS — S060X0A Concussion without loss of consciousness, initial encounter: Secondary | ICD-10-CM

## 2017-04-14 MED ORDER — IBUPROFEN 600 MG PO TABS
600.0000 mg | ORAL_TABLET | Freq: Once | ORAL | Status: AC
  Administered 2017-04-14: 600 mg via ORAL

## 2017-04-14 MED ORDER — IBUPROFEN 600 MG PO TABS
ORAL_TABLET | ORAL | Status: AC
  Filled 2017-04-14: qty 1
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
## 2018-01-15 ENCOUNTER — Telehealth: Payer: Self-pay

## 2018-01-15 NOTE — Telephone Encounter (Signed)
Pt has a annual schedule for 12/4, she needs mammo orders in so she can schedule her mammogram

## 2018-01-20 ENCOUNTER — Other Ambulatory Visit: Payer: Self-pay | Admitting: Advanced Practice Midwife

## 2018-01-20 DIAGNOSIS — Z1239 Encounter for other screening for malignant neoplasm of breast: Secondary | ICD-10-CM

## 2018-01-20 NOTE — Progress Notes (Signed)
Referral sent for screening mammogram per patient request.

## 2018-01-20 NOTE — Telephone Encounter (Signed)
Sent referral for patient to have screening mammogram- she would like to try and schedule the same day as her annual exam here.

## 2018-02-05 ENCOUNTER — Ambulatory Visit (INDEPENDENT_AMBULATORY_CARE_PROVIDER_SITE_OTHER): Payer: BLUE CROSS/BLUE SHIELD | Admitting: Advanced Practice Midwife

## 2018-02-05 ENCOUNTER — Encounter: Payer: Self-pay | Admitting: Advanced Practice Midwife

## 2018-02-05 ENCOUNTER — Ambulatory Visit
Admission: RE | Admit: 2018-02-05 | Discharge: 2018-02-05 | Disposition: A | Discharge status: Home / Self Care | Payer: BLUE CROSS/BLUE SHIELD | Source: Ambulatory Visit | Attending: Advanced Practice Midwife | Admitting: Advanced Practice Midwife

## 2018-02-05 VITALS — BP 120/80 | Ht 70.0 in | Wt 218.0 lb

## 2018-02-05 DIAGNOSIS — Z1239 Encounter for other screening for malignant neoplasm of breast: Secondary | ICD-10-CM | POA: Diagnosis not present

## 2018-02-05 DIAGNOSIS — Z0000 Encounter for general adult medical examination without abnormal findings: Secondary | ICD-10-CM

## 2018-02-05 DIAGNOSIS — Z01419 Encounter for gynecological examination (general) (routine) without abnormal findings: Secondary | ICD-10-CM | POA: Diagnosis not present

## 2018-02-05 NOTE — Patient Instructions (Signed)
Preventive Care 40-64 Years, Female Preventive care refers to lifestyle choices and visits with your health care provider that can promote health and wellness. What does preventive care include?  A yearly physical exam. This is also called an annual well check.  Dental exams once or twice a year.  Routine eye exams. Ask your health care provider how often you should have your eyes checked.  Personal lifestyle choices, including: ? Daily care of your teeth and gums. ? Regular physical activity. ? Eating a healthy diet. ? Avoiding tobacco and drug use. ? Limiting alcohol use. ? Practicing safe sex. ? Taking low-dose aspirin daily starting at age 58. ? Taking vitamin and mineral supplements as recommended by your health care provider. What happens during an annual well check? The services and screenings done by your health care provider during your annual well check will depend on your age, overall health, lifestyle risk factors, and family history of disease. Counseling Your health care provider may ask you questions about your:  Alcohol use.  Tobacco use.  Drug use.  Emotional well-being.  Home and relationship well-being.  Sexual activity.  Eating habits.  Work and work Statistician.  Method of birth control.  Menstrual cycle.  Pregnancy history.  Screening You may have the following tests or measurements:  Height, weight, and BMI.  Blood pressure.  Lipid and cholesterol levels. These may be checked every 5 years, or more frequently if you are over 81 years old.  Skin check.  Lung cancer screening. You may have this screening every year starting at age 78 if you have a 30-pack-year history of smoking and currently smoke or have quit within the past 15 years.  Fecal occult blood test (FOBT) of the stool. You may have this test every year starting at age 65.  Flexible sigmoidoscopy or colonoscopy. You may have a sigmoidoscopy every 5 years or a colonoscopy  every 10 years starting at age 30.  Hepatitis C blood test.  Hepatitis B blood test.  Sexually transmitted disease (STD) testing.  Diabetes screening. This is done by checking your blood sugar (glucose) after you have not eaten for a while (fasting). You may have this done every 1-3 years.  Mammogram. This may be done every 1-2 years. Talk to your health care provider about when you should start having regular mammograms. This may depend on whether you have a family history of breast cancer.  BRCA-related cancer screening. This may be done if you have a family history of breast, ovarian, tubal, or peritoneal cancers.  Pelvic exam and Pap test. This may be done every 3 years starting at age 80. Starting at age 36, this may be done every 5 years if you have a Pap test in combination with an HPV test.  Bone density scan. This is done to screen for osteoporosis. You may have this scan if you are at high risk for osteoporosis.  Discuss your test results, treatment options, and if necessary, the need for more tests with your health care provider. Vaccines Your health care provider may recommend certain vaccines, such as:  Influenza vaccine. This is recommended every year.  Tetanus, diphtheria, and acellular pertussis (Tdap, Td) vaccine. You may need a Td booster every 10 years.  Varicella vaccine. You may need this if you have not been vaccinated.  Zoster vaccine. You may need this after age 5.  Measles, mumps, and rubella (MMR) vaccine. You may need at least one dose of MMR if you were born in  1959 or later. You may also need a second dose.  Pneumococcal 13-valent conjugate (PCV13) vaccine. You may need this if you have certain conditions and were not previously vaccinated.  Pneumococcal polysaccharide (PPSV23) vaccine. You may need one or two doses if you smoke cigarettes or if you have certain conditions.  Meningococcal vaccine. You may need this if you have certain  conditions.  Hepatitis A vaccine. You may need this if you have certain conditions or if you travel or work in places where you may be exposed to hepatitis A.  Hepatitis B vaccine. You may need this if you have certain conditions or if you travel or work in places where you may be exposed to hepatitis B.  Haemophilus influenzae type b (Hib) vaccine. You may need this if you have certain conditions.  Talk to your health care provider about which screenings and vaccines you need and how often you need them. This information is not intended to replace advice given to you by your health care provider. Make sure you discuss any questions you have with your health care provider. Document Released: 03/18/2015 Document Revised: 11/09/2015 Document Reviewed: 12/21/2014 Elsevier Interactive Patient Education  2018 [place] refers to food and lifestyle choices that are based on the traditions of countries located on the [employer]. This way of eating has been shown to help prevent certain conditions and improve outcomes for people who have chronic diseases, like kidney disease and heart disease. What are tips for following this plan? Lifestyle  Cook and eat meals together with your family, when possible.  Drink enough fluid to keep your urine clear or pale yellow.  Be physically active every day. This includes: ? Aerobic exercise like running or swimming. ? Leisure activities like gardening, walking, or housework.  Get 7-8 hours of sleep each night.  If recommended by your health care provider, drink red wine in moderation. This means 1 glass a day for nonpregnant women and 2 glasses a day for men. A glass of wine equals 5 oz (150 mL). Reading food labels  Check the serving size of packaged foods. For foods such as rice and pasta, the serving size refers to the amount of cooked product, not dry.  Check the total fat in packaged foods. Avoid  foods that have saturated fat or trans fats.  Check the ingredients list for added sugars, such as corn syrup. Shopping  At the grocery store, buy most of your food from the areas near the walls of the store. This includes: ? Fresh fruits and vegetables (produce). ? Grains, beans, nuts, and seeds. Some of these may be available in unpackaged forms or large amounts (in bulk). ? Fresh seafood. ? Poultry and eggs. ? Low-fat dairy products.  Buy whole ingredients instead of prepackaged foods.  Buy fresh fruits and vegetables in-season from local farmers markets.  Buy frozen fruits and vegetables in resealable bags.  If you do not have access to quality fresh seafood, buy precooked frozen shrimp or canned fish, such as tuna, salmon, or sardines.  Buy small amounts of raw or cooked vegetables, salads, or olives from the deli or salad bar at your store.  Stock your pantry so you always have certain foods on hand, such as olive oil, canned tuna, canned tomatoes, rice, pasta, and beans. Cooking  Cook foods with extra-virgin olive oil instead of using butter or other vegetable oils.  Have meat as a side dish, and have vegetables or grains  as your main dish. This means having meat in small portions or adding small amounts of meat to foods like pasta or stew.  Use beans or vegetables instead of meat in common dishes like chili or lasagna.  Experiment with different cooking methods. Try roasting or broiling vegetables instead of steaming or sauteing them.  Add frozen vegetables to soups, stews, pasta, or rice.  Add nuts or seeds for added healthy fat at each meal. You can add these to yogurt, salads, or vegetable dishes.  Marinate fish or vegetables using olive oil, lemon juice, garlic, and fresh herbs. Meal planning  Plan to eat 1 vegetarian meal one day each week. Try to work up to 2 vegetarian meals, if possible.  Eat seafood 2 or more times a week.  Have healthy snacks readily  available, such as: ? Vegetable sticks with hummus. ? [place] yogurt. ? Fruit and nut trail mix.  Eat balanced meals throughout the week. This includes: ? Fruit: 2-3 servings a day ? Vegetables: 4-5 servings a day ? Low-fat dairy: 2 servings a day ? Fish, poultry, or lean meat: 1 serving a day ? Beans and legumes: 2 or more servings a week ? Nuts and seeds: 1-2 servings a day ? Whole grains: 6-8 servings a day ? Extra-virgin olive oil: 3-4 servings a day  Limit red meat and sweets to only a few servings a month What are my food choices?  Mediterranean diet ? Recommended ? Grains: Whole-grain pasta. Brown rice. Bulgar wheat. Polenta. Couscous. Whole-wheat bread. [place]. ? Vegetables: Artichokes. Beets. Broccoli. Cabbage. Carrots. Eggplant. Green beans. Chard. Kale. Spinach. Onions. Leeks. Peas. Squash. Tomatoes. Peppers. Radishes. ? Fruits: Apples. Apricots. Avocado. Berries. Bananas. Cherries. Dates. Figs. Grapes. Lemons. Melon. Oranges. Peaches. Plums. Pomegranate. ? Meats and other protein foods: Beans. Almonds. Sunflower seeds. Pine nuts. Peanuts. [place]. Salmon. Scallops. Shrimp. [place]. Tilapia. Clams. Oysters. Eggs. ? Dairy: Low-fat milk. Cheese. Greek yogurt. ? Beverages: Water. Red wine. Herbal tea. ? Fats and oils: Extra virgin olive oil. Avocado oil. Grape seed oil. ? Sweets and desserts: [place] yogurt with honey. Baked apples. Poached pears. Trail mix. ? Seasoning and other foods: Basil. Cilantro. Coriander. Cumin. Mint. Parsley. Sage. Rosemary. Tarragon. Garlic. Oregano. Thyme. Pepper. Balsalmic vinegar. Tahini. Hummus. Tomato sauce. Olives. Mushrooms. ? Limit these ? Grains: Prepackaged pasta or rice dishes. Prepackaged cereal with added sugar. ? Vegetables: Deep fried potatoes (french fries). ? Fruits: Fruit canned in syrup. ? Meats and other protein foods: Beef. Pork. Lamb. Poultry with skin. Hot dogs. [place] Salines. ? Dairy: Ice cream. Sour cream. Whole milk. ? Beverages:  Juice. Sugar-sweetened soft drinks. Beer. Liquor and spirits. ? Fats and oils: Butter. Canola oil. Vegetable oil. Beef fat (tallow). Lard. ? Sweets and desserts: Cookies. Cakes. Pies. Candy. ? Seasoning and other foods: Mayonnaise. Premade sauces and marinades. ? The items listed may not be a complete list. Talk with your dietitian about what dietary choices are right for you. Summary  The Mediterranean diet includes both food and lifestyle choices.  Eat a variety of fresh fruits and vegetables, beans, nuts, seeds, and whole grains.  Limit the amount of red meat and sweets that you eat.  Talk with your health care provider about whether it is safe for you to drink red wine in moderation. This means 1 glass a day for nonpregnant women and 2 glasses a day for men. A glass of wine equals 5 oz (150 mL). This information is not intended to replace advice given to you by  your health care provider. Make sure you discuss any questions you have with your health care provider. Document Released: 10/13/2015 Document Revised: 11/15/2015 Document Reviewed: 10/13/2015 Elsevier Interactive Patient Education  2018 [place] (AHA) Exercise Recommendation  Being physically active is important to prevent heart disease and stroke, the nation's No. 1and No. 5killers. To improve overall cardiovascular health, we suggest at least 150 minutes per week of moderate exercise or 75 minutes per week of vigorous exercise (or a combination of moderate and vigorous activity). Thirty minutes a day, five times a week is an easy goal to remember. You will also experience benefits even if you divide your time into two or three segments of 10 to 15 minutes per day.  For people who would benefit from lowering their blood pressure or cholesterol, we recommend 40 minutes of aerobic exercise of moderate to vigorous intensity three to four times a week to lower the risk for heart attack and  stroke.  Physical activity is anything that makes you move your body and burn calories.  This includes things like climbing stairs or playing sports. Aerobic exercises benefit your heart, and include walking, jogging, swimming or biking. Strength and stretching exercises are best for overall stamina and flexibility.  The simplest, positive change you can make to effectively improve your heart health is to start walking. It's enjoyable, free, easy, social and great exercise. A walking program is flexible and boasts high success rates because people can stick with it. It's easy for walking to become a regular and satisfying part of life.   For Overall Cardiovascular Health:  At least 30 minutes of moderate-intensity aerobic activity at least 5 days per week for a total of 150  OR   At least 25 minutes of vigorous aerobic activity at least 3 days per week for a total of 75 minutes; or a combination of moderate- and vigorous-intensity aerobic activity  AND   Moderate- to high-intensity muscle-strengthening activity at least 2 days per week for additional health benefits.  For Lowering Blood Pressure and Cholesterol  An average 40 minutes of moderate- to vigorous-intensity aerobic activity 3 or 4 times per week  What if I can't make it to the time goal? Something is always better than nothing! And everyone has to start somewhere. Even if you've been sedentary for years, today is the day you can begin to make healthy changes in your life. If you don't think you'll make it for 30 or 40 minutes, set a reachable goal for today. You can work up toward your overall goal by increasing your time as you get stronger. Don't let all-or-nothing thinking rob you of doing what you can every day.  Source:http://www.heart.org   Coping with Quitting Smoking Quitting smoking is a physical and mental challenge. You will face cravings, withdrawal symptoms, and temptation. Before quitting, work with your  health care provider to make a plan that can help you cope. Preparation can help you quit and keep you from giving in. How can I cope with cravings? Cravings usually last for 5-10 minutes. If you get through it, the craving will pass. Consider taking the following actions to help you cope with cravings:  Keep your mouth busy: ? Chew sugar-free gum. ? Suck on hard candies or a straw. ? Brush your teeth.  Keep your hands and body busy: ? Immediately change to a different activity when you feel a craving. ? Squeeze or play with a ball. ? Do an activity or  a hobby, like making bead jewelry, practicing needlepoint, or working with wood. ? Mix up your normal routine. ? Take a short exercise break. Go for a quick walk or run up and down stairs. ? Spend time in public places where smoking is not allowed.  Focus on doing something kind or helpful for someone else.  Call a friend or family member to talk during a craving.  Join a support group.  Call a quit line, such as 1-800-QUIT-NOW.  Talk with your health care provider about medicines that might help you cope with cravings and make quitting easier for you.  How can I deal with withdrawal symptoms? Your body may experience negative effects as it tries to get used to not having nicotine in the system. These effects are called withdrawal symptoms. They may include:  Feeling hungrier than normal.  Trouble concentrating.  Irritability.  Trouble sleeping.  Feeling depressed.  Restlessness and agitation.  Craving a cigarette.  To manage withdrawal symptoms:  Avoid places, people, and activities that trigger your cravings.  Remember why you want to quit.  Get plenty of sleep.  Avoid coffee and other caffeinated drinks. These may worsen some of your symptoms.  How can I handle social situations? Social situations can be difficult when you are quitting smoking, especially in the first few weeks. To manage this, you  can:  Avoid parties, bars, and other social situations where people might be smoking.  Avoid alcohol.  Leave right away if you have the urge to smoke.  Explain to your family and friends that you are quitting smoking. Ask for understanding and support.  Plan activities with friends or family where smoking is not an option.  What are some ways I can cope with stress? Wanting to smoke may cause stress, and stress can make you want to smoke. Find ways to manage your stress. Relaxation techniques can help. For example:  Breathe slowly and deeply, in through your nose and out through your mouth.  Listen to soothing, relaxing music.  Talk with a family member or friend about your stress.  Light a candle.  Soak in a bath or take a shower.  Think about a peaceful place.  What are some ways I can prevent weight gain? Be aware that many people gain weight after they quit smoking. However, not everyone does. To keep from gaining weight, have a plan in place before you quit and stick to the plan after you quit. Your plan should include:  Having healthy snacks. When you have a craving, it may help to: ? Eat plain popcorn, crunchy carrots, celery, or other cut vegetables. ? Chew sugar-free gum.  Changing how you eat: ? Eat small portion sizes at meals. ? Eat 4-6 small meals throughout the day instead of 1-2 large meals a day. ? Be mindful when you eat. Do not watch television or do other things that might distract you as you eat.  Exercising regularly: ? Make time to exercise each day. If you do not have time for a long workout, do short bouts of exercise for 5-10 minutes several times a day. ? Do some form of strengthening exercise, like weight lifting, and some form of aerobic exercise, like running or swimming.  Drinking plenty of water or other low-calorie or no-calorie drinks. Drink 6-8 glasses of water daily, or as much as instructed by your health care  provider.  Summary  Quitting smoking is a physical and mental challenge. You will face cravings, withdrawal symptoms,  and temptation to smoke again. Preparation can help you as you go through these challenges.  You can cope with cravings by keeping your mouth busy (such as by chewing gum), keeping your body and hands busy, and making calls to family, friends, or a helpline for people who want to quit smoking.  You can cope with withdrawal symptoms by avoiding places where people smoke, avoiding drinks with caffeine, and getting plenty of rest.  Ask your health care provider about the different ways to prevent weight gain, avoid stress, and handle social situations. This information is not intended to replace advice given to you by your health care provider. Make sure you discuss any questions you have with your health care provider. Document Released: 02/17/2016 Document Revised: 02/17/2016 Document Reviewed: 02/17/2016 Elsevier Interactive Patient Education  2018 [place] After being diagnosed with an anxiety disorder, you may be relieved to know why you have felt or behaved a certain way. It is natural to also feel overwhelmed about the treatment ahead and what it will mean for your life. With care and support, you can manage this condition and recover from it. How to cope with anxiety Dealing with stress Stress is your body's reaction to life changes and events, both good and bad. Stress can last just a few hours or it can be ongoing. Stress can play a major role in anxiety, so it is important to learn both how to cope with stress and how to think about it differently. Talk with your health care provider or a counselor to learn more about stress reduction. He or she may suggest some stress reduction techniques, such as:  Music therapy. This can include creating or listening to music that you enjoy and that inspires you.  Mindfulness-based meditation. This involves  being aware of your normal breaths, rather than trying to control your breathing. It can be done while sitting or walking.  Centering prayer. This is a kind of meditation that involves focusing on a word, phrase, or sacred image that is meaningful to you and that brings you peace.  Deep breathing. To do this, expand your stomach and inhale slowly through your nose. Hold your breath for 3-5 seconds. Then exhale slowly, allowing your stomach muscles to relax.  Self-talk. This is a skill where you identify thought patterns that lead to anxiety reactions and correct those thoughts.  Muscle relaxation. This involves tensing muscles then relaxing them.  Choose a stress reduction technique that fits your lifestyle and personality. Stress reduction techniques take time and practice. Set aside 5-15 minutes a day to do them. Therapists can offer training in these techniques. The training may be covered by some insurance plans. Other things you can do to manage stress include:  Keeping a stress diary. This can help you learn what triggers your stress and ways to control your response.  Thinking about how you respond to certain situations. You may not be able to control everything, but you can control your reaction.  Making time for activities that help you relax, and not feeling guilty about spending your time in this way.  Therapy combined with coping and stress-reduction skills provides the best chance for successful treatment. Medicines Medicines can help ease symptoms. Medicines for anxiety include:  Anti-anxiety drugs.  Antidepressants.  Beta-blockers.  Medicines may be used as the main treatment for anxiety disorder, along with therapy, or if other treatments are not working. Medicines should be prescribed by a health care provider. Relationships Relationships  can play a big part in helping you recover. Try to spend more time connecting with trusted friends and family members. Consider going  to couples counseling, taking family education classes, or going to family therapy. Therapy can help you and others better understand the condition. How to recognize changes in your condition Everyone has a different response to treatment for anxiety. Recovery from anxiety happens when symptoms decrease and stop interfering with your daily activities at home or work. This may mean that you will start to:  Have better concentration and focus.  Sleep better.  Be less irritable.  Have more energy.  Have improved memory.  It is important to recognize when your condition is getting worse. Contact your health care provider if your symptoms interfere with home or work and you do not feel like your condition is improving. Where to find help and support: You can get help and support from these sources:  Self-help groups.  Online and [employer].  A trusted spiritual leader.  Couples counseling.  Family education classes.  Family therapy.  Follow these instructions at home:  Eat a healthy diet that includes plenty of vegetables, fruits, whole grains, low-fat dairy products, and lean protein. Do not eat a lot of foods that are high in solid fats, added sugars, or salt.  Exercise. Most adults should do the following: ? Exercise for at least 150 minutes each week. The exercise should increase your heart rate and make you sweat (moderate-intensity exercise). ? Strengthening exercises at least twice a week.  Cut down on caffeine, tobacco, alcohol, and other potentially harmful substances.  Get the right amount and quality of sleep. Most adults need 7-9 hours of sleep each night.  Make choices that simplify your life.  Take over-the-counter and prescription medicines only as told by your health care provider.  Avoid caffeine, alcohol, and certain over-the-counter cold medicines. These may make you feel worse. Ask your pharmacist which medicines to avoid.  Keep all  follow-up visits as told by your health care provider. This is important. Questions to ask your health care provider  Would I benefit from therapy?  How often should I follow up with a health care provider?  How long do I need to take medicine?  Are there any long-term side effects of my medicine?  Are there any alternatives to taking medicine? Contact a health care provider if:  You have a hard time staying focused or finishing daily tasks.  You spend many hours a day feeling worried about everyday life.  You become exhausted by worry.  You start to have headaches, feel tense, or have nausea.  You urinate more than normal.  You have diarrhea. Get help right away if:  You have a racing heart and shortness of breath.  You have thoughts of hurting yourself or others. If you ever feel like you may hurt yourself or others, or have thoughts about taking your own life, get help right away. You can go to your nearest emergency department or call:  Your local emergency services (911 in the [place]  A suicide crisis helpline, such as the [place] at (231) 809-3129. This is open 24-hours a day.  Summary  Taking steps to deal with stress can help calm you.  Medicines cannot cure anxiety disorders, but they can help ease symptoms.  Family, friends, and partners can play a big part in helping you recover from an anxiety disorder. This information is not intended to replace advice given to you  by your health care provider. Make sure you discuss any questions you have with your health care provider. Document Released: 02/14/2016 Document Revised: 02/14/2016 Document Reviewed: 02/14/2016 Elsevier Interactive Patient Education  [employer].

## 2018-02-05 NOTE — Progress Notes (Signed)
Patient ID: Sandra Harper, female   DOB: Aug 30, 1970, 47 y.o.   MRN: [id number]    Gynecology Annual Exam  PCP: Rod Can, CNM  Chief Complaint:  Chief Complaint  Patient presents with  . Gynecologic Exam    History of Present Illness: Patient is a 47 y.o. G2P2002 presents for annual exam. The patient has no gyn complaints today. Her main concerns are of anxiety related to every day life stressors and she is interested in quitting smoking. She is not requesting medication help for these concerns. Discussed importance of non-pharmacological methods of stress management and smoking cessation including healthy lifestyle: diet, exercise, adequate sleep and hydration, mindfulness, counseling.   LMP: Patient's last menstrual period was 02/16/2016 (approximate). Postmenopausal  Postcoital Bleeding: no Dysmenorrhea: not applicable   The patient is sexually active. She currently uses vasectomy for contraception. She denies dyspareunia.  The patient does perform self breast exams.  There is no notable family history of breast or ovarian cancer in her family.  The patient wears seatbelts: yes.   The patient has regular exercise: She has limited exercise of walking 1 mile 1-2 days per week. She denies cardio exercise.    The patient denies current symptoms of depression.    Review of Systems: Review of Systems  Constitutional: Negative.   HENT: Negative.   Eyes: Negative.   Respiratory: Negative.   Cardiovascular: Negative.   Gastrointestinal: Negative.   Genitourinary: Negative.   Musculoskeletal: Negative.   Skin: Negative.   Neurological: Negative.   Endo/Heme/Allergies: Negative.   Psychiatric/Behavioral:       Anxiety     Past Medical History:  Past Medical History:  Diagnosis Date  . Abnormal Pap smear of cervix     Past Surgical History:  Past Surgical History:  Procedure Laterality Date  . BREAST BIOPSY Left 2010   Dr Donell Beers per pt  . CERVICAL BIOPSY  W/  LOOP ELECTRODE EXCISION    . CESAREAN SECTION    . COLONOSCOPY WITH PROPOFOL N/A 02/15/2017   Procedure: COLONOSCOPY WITH PROPOFOL;  Surgeon: Virgel Manifold, MD;  Location: [facility] ENDOSCOPY;  Service: Endoscopy;  Laterality: N/A;  . CRYOTHERAPY    . DILATION AND CURETTAGE OF UTERUS    . KNEE ARTHROSCOPY Left   . MOHS SURGERY     laser removal of melanoma right shoulder    Gynecologic History:  Patient's last menstrual period was 02/16/2016 (approximate). Contraception: vasectomy Last Pap: 1 year ago Results were:  no abnormalities  Last mammogram: 4 years ago Results were: BI-RAD I  Obstetric History: U2P5361  Family History:  Family History  Problem Relation Age of Onset  . Diabetes Mother   . Heart attack Father   . Liver disease Sister   . Pancreatitis Paternal Grandmother   . Breast cancer Neg Hx     Social History:  Social History   Socioeconomic History  . Marital status: Married    Spouse name: Not on file  . Number of children: Not on file  . Years of education: Not on file  . Highest education level: Not on file  Occupational History  . Not on file  Social Needs  . Financial resource strain: Not on file  . Food insecurity:    Worry: Not on file    Inability: Not on file  . Transportation needs:    Medical: Not on file    Non-medical: Not on file  Tobacco Use  . Smoking status: Current Every Day Smoker  . Smokeless  tobacco: Never Used  Substance and Sexual Activity  . Alcohol use: No    Frequency: Never  . Drug use: No  . Sexual activity: Yes    Birth control/protection: Surgical  Lifestyle  . Physical activity:    Days per week: Not on file    Minutes per session: Not on file  . Stress: Not on file  Relationships  . Social connections:    Talks on phone: Not on file    Gets together: Not on file    Attends religious service: Not on file    Active member of club or organization: Not on file    Attends meetings of clubs or organizations:  Not on file    Relationship status: Not on file  . Intimate partner violence:    Fear of current or ex partner: Not on file    Emotionally abused: Not on file    Physically abused: Not on file    Forced sexual activity: Not on file  Other Topics Concern  . Not on file  Social History Narrative  . Not on file    Allergies:  Allergies  Allergen Reactions  . Codeine     Medications: Prior to Admission medications   Not on File    Physical Exam Vitals: Blood pressure 120/80, height 5\' 10"  (1.778 m), weight 218 lb (98.9 kg), last menstrual period 02/16/2016.  General: NAD HEENT: normocephalic, anicteric Thyroid: no enlargement, no palpable nodules Pulmonary: No increased work of breathing, CTAB Cardiovascular: RRR, distal pulses 2+ Breast: Breast symmetrical, no tenderness, no palpable nodules or masses, no skin or nipple retraction present, no nipple discharge.  No axillary or supraclavicular lymphadenopathy. Abdomen: NABS, soft, non-tender, non-distended.  Umbilicus without lesions.  No hepatomegaly, splenomegaly or masses palpable. No evidence of hernia  Genitourinary: deferred for no concerns/PAP interval Extremities: no edema, erythema, or tenderness Neurologic: Grossly intact Psychiatric: mood appropriate, affect full    Assessment: 47 y.o. G2P2002 routine annual exam  Plan: Problem List Items Addressed This Visit    None    Visit Diagnoses    Well woman exam without gynecological exam    -  Primary      1) Mammogram - recommend yearly screening mammogram.  Mammogram is scheduled for later today   2) STI screening  was offered and declined  3) ASCCP guidelines and rational discussed.  Patient opts for every 3 years screening interval  4) Contraception - the patient is currently using  vasectomy.  She is happy with her current form of contraception and plans to continue  5) Colonoscopy is up to date and was completed on 02/15/2017-- Screening recommended  starting at age 58 for average risk individuals, age 78 for individuals deemed at increased risk (including African Americans) and recommended to continue until age 1.  For patient age 85-85 individualized approach is recommended.  Gold standard screening is via colonoscopy, Cologuard screening is an acceptable alternative for patient unwilling or unable to undergo colonoscopy.  "Colorectal cancer screening for average?risk adults: 2018 guideline update from the American Cancer Society"CA: A Cancer Journal for Clinicians: Aug 01, 2016   6) Routine healthcare maintenance including cholesterol, diabetes screening discussed Declines  7) Return in 1 year (on 02/06/2019).   Rod Can, CNM [facility] 02/06/2018, 10:42 AM

## 2018-09-25 ENCOUNTER — Other Ambulatory Visit: Payer: Self-pay | Admitting: Advanced Practice Midwife

## 2018-09-25 DIAGNOSIS — Z20822 Contact with and (suspected) exposure to covid-19: Secondary | ICD-10-CM

## 2018-09-28 LAB — SPECIMEN STATUS REPORT

## 2018-09-28 LAB — NOVEL CORONAVIRUS, NAA: SARS-CoV-2, NAA: NOT DETECTED

## 2019-06-18 IMAGING — MG DIGITAL SCREENING BILATERAL MAMMOGRAM WITH TOMO AND CAD
6 of 10 series · 6 of 30 positions shown · non-contrast
Comparison: Previous exam(s).

CLINICAL DATA: Screening.

EXAM:
DIGITAL SCREENING BILATERAL MAMMOGRAM WITH TOMO AND CAD

[R CC synth-2D]
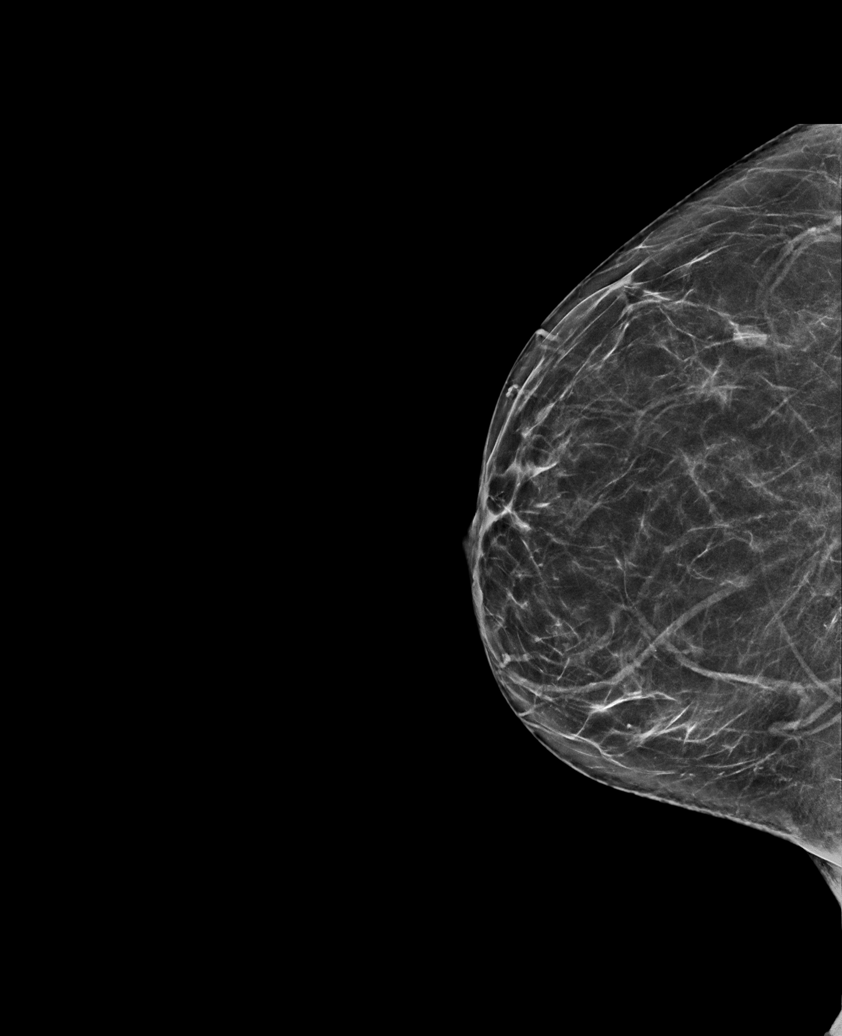

[R MLO synth-2D]
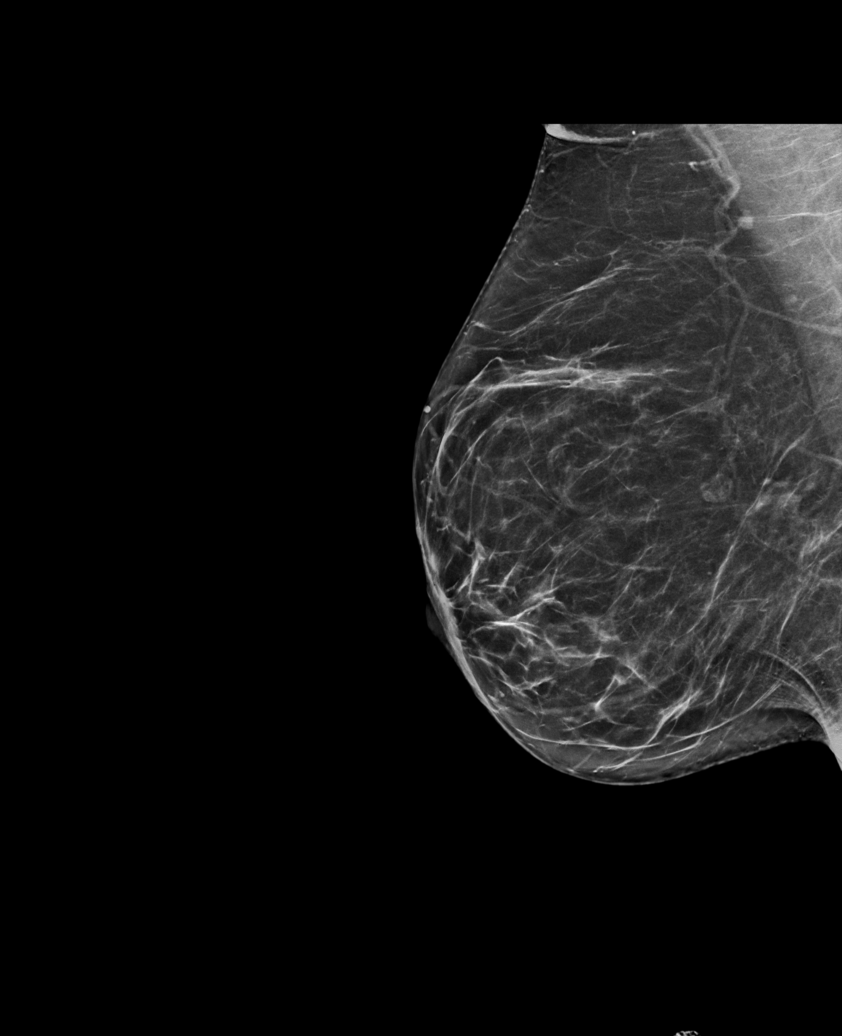

[L CC synth-2D]
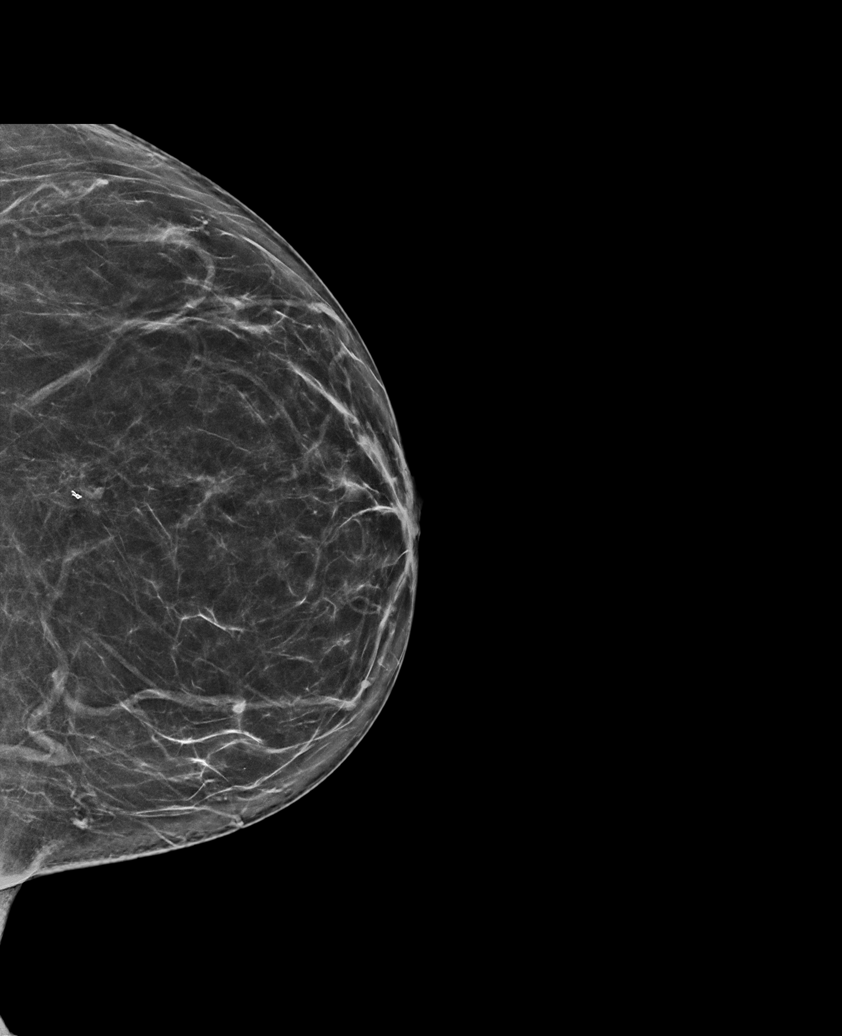

[L MLO synth-2D (1 of 2)]
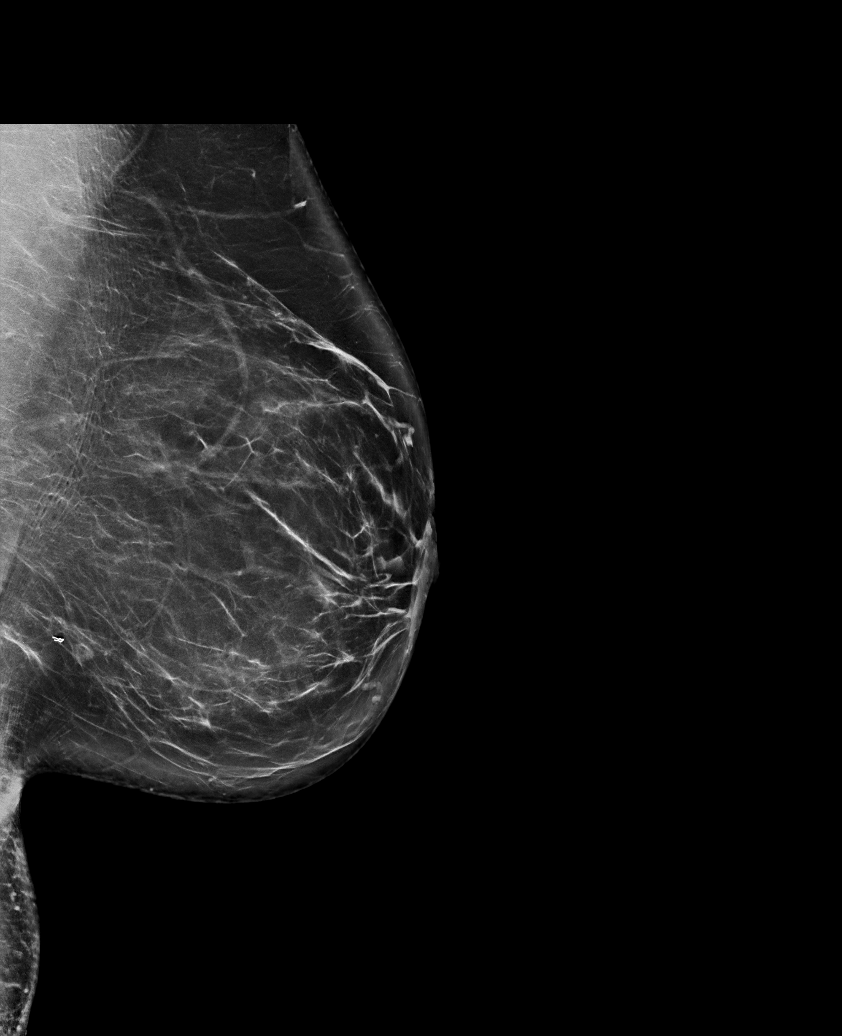

[L MLO synth-2D (2 of 2)]
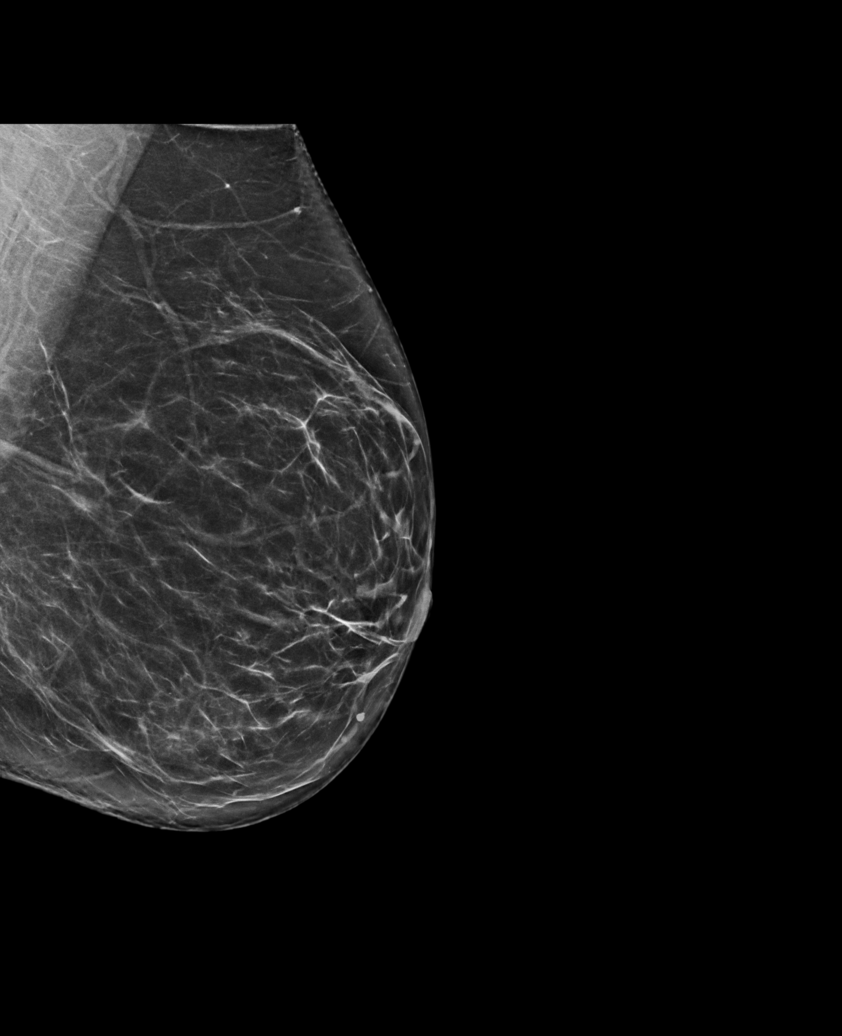

[L CC tomo · tomo slice 38/75.0]
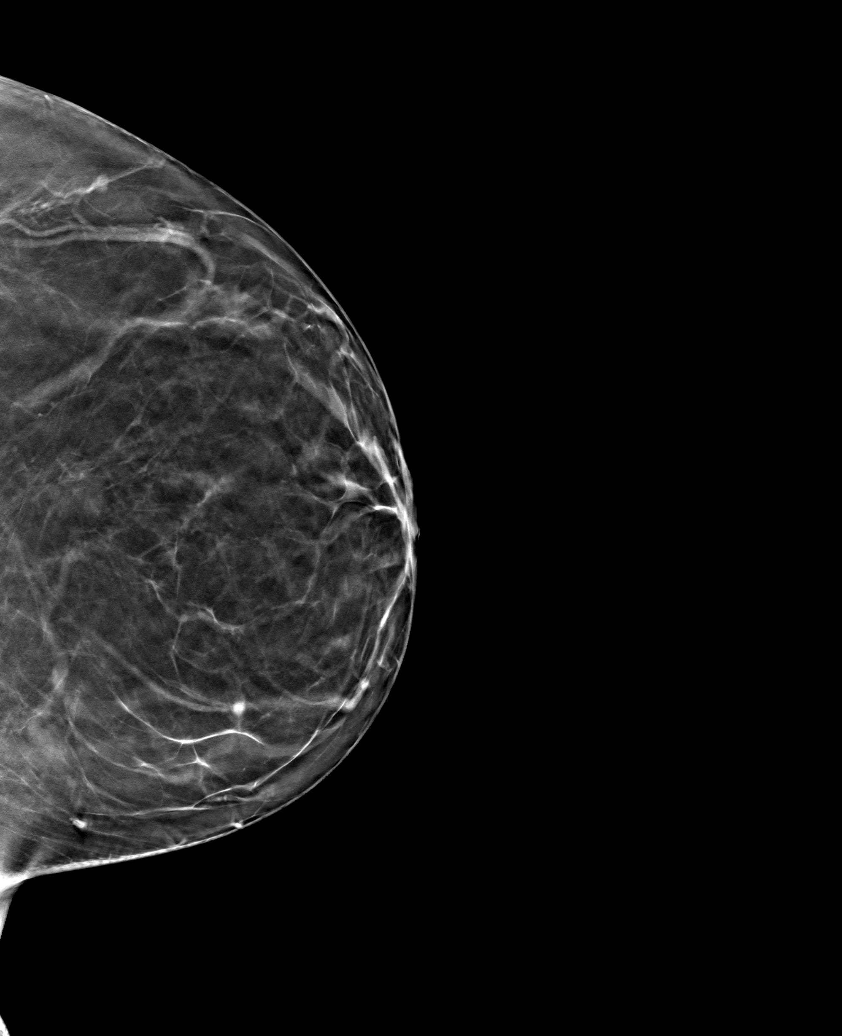

[6 of 30 positions shown; findings below may reference images not displayed]

ACR Breast Density Category b: There are scattered areas of
fibroglandular density.
FINDINGS: There are no findings suspicious for malignancy. Images were
processed with CAD.
IMPRESSION: No mammographic evidence of malignancy. A result letter of this
screening mammogram will be mailed directly to the patient.

RECOMMENDATION:
Screening mammogram in one year. (Code:[id number]-775)

BI-RADS CATEGORY  1: Negative.

## 2021-12-07 DIAGNOSIS — M1712 Unilateral primary osteoarthritis, left knee: Secondary | ICD-10-CM | POA: Insufficient documentation

## 2022-07-27 ENCOUNTER — Other Ambulatory Visit: Payer: Self-pay | Admitting: Family Medicine

## 2022-07-27 DIAGNOSIS — Z1231 Encounter for screening mammogram for malignant neoplasm of breast: Secondary | ICD-10-CM

## 2022-08-01 ENCOUNTER — Ambulatory Visit
Admission: RE | Admit: 2022-08-01 | Discharge: 2022-08-01 | Disposition: A | Discharge status: Home / Self Care | Payer: BC Managed Care – PPO | Source: Ambulatory Visit | Attending: Family Medicine | Admitting: Family Medicine

## 2022-08-01 DIAGNOSIS — Z1231 Encounter for screening mammogram for malignant neoplasm of breast: Secondary | ICD-10-CM | POA: Insufficient documentation

## 2022-08-23 ENCOUNTER — Other Ambulatory Visit (INDEPENDENT_AMBULATORY_CARE_PROVIDER_SITE_OTHER): Payer: Self-pay | Admitting: Nurse Practitioner

## 2022-08-23 DIAGNOSIS — I8311 Varicose veins of right lower extremity with inflammation: Secondary | ICD-10-CM

## 2022-08-29 ENCOUNTER — Ambulatory Visit (INDEPENDENT_AMBULATORY_CARE_PROVIDER_SITE_OTHER): Payer: BC Managed Care – PPO | Admitting: Nurse Practitioner

## 2022-08-29 ENCOUNTER — Ambulatory Visit (INDEPENDENT_AMBULATORY_CARE_PROVIDER_SITE_OTHER): Payer: BC Managed Care – PPO

## 2022-08-29 ENCOUNTER — Encounter (INDEPENDENT_AMBULATORY_CARE_PROVIDER_SITE_OTHER): Payer: Self-pay | Admitting: Nurse Practitioner

## 2022-08-29 VITALS — BP 121/80 | HR 71 | Resp 16 | Ht 70.0 in | Wt 222.8 lb

## 2022-08-29 DIAGNOSIS — I8311 Varicose veins of right lower extremity with inflammation: Secondary | ICD-10-CM

## 2022-08-29 DIAGNOSIS — I83811 Varicose veins of right lower extremities with pain: Secondary | ICD-10-CM

## 2022-08-29 NOTE — Progress Notes (Signed)
Subjective:    Patient ID: Sandra Harper, female    DOB: [birth year], 52 y.o.   MRN: [id number] Chief Complaint  Patient presents with   New Patient (Initial Visit)    np. RLE reflux + consult. RLE VV w/pain. referred by fields, glenda    Sandra Harper is a 52 year old female that is seen for evaluation of symptomatic varicose veins.  The patient relates having a varicosity in her right lower extremity for years which generally has not given her issues until recently.  She notes that she has been having some pain and tenderness within the vein itself especially after he has been standing and in prolonged dependent positions.  She notes that she has had worsening cramping of her right lower extremity as well.  This cramping pain and tenderness is much more intensive.  She notes that the cramping and pain occur directly in the area where her varicosity is.  She also notes that the varicosity has also worsened and grew over the last few months and noticed there has been a worsening pain associated with this. The patient states the pain from the varicose veins interferes with work, daily exercise, shopping and household maintenance. At this point, the symptoms are persistent and severe enough that they're having a negative impact on lifestyle and are interfering with daily activities.  There is no history of DVT, PE or superficial thrombophlebitis. There is no history of ulceration or hemorrhage. The patient denies a significant family history of varicose veins.  The patient has worn graduated compression in the past. At the present time the patient has been using over-the-counter analgesics. There is no history of prior surgical intervention or sclerotherapy.  Today noninvasive studies show no evidence of DVT or superficial phlebitis in the right lower extremity.  There is significant venous insufficiency in the deep venous system.  There is also significant venous reflux in the right great saphenous  vein.    Review of Systems  Musculoskeletal:        Leg cramping  All other systems reviewed and are negative.      Objective:   Physical Exam Vitals reviewed.  HENT:     Head: Normocephalic.  Cardiovascular:     Rate and Rhythm: Normal rate.  Pulmonary:     Effort: Pulmonary effort is normal.  Musculoskeletal:        General: Tenderness present.  Skin:    General: Skin is warm and dry.     Comments: Large varicosity extending from the right mid calf through proximal thigh  Neurological:     Mental Status: She is alert and oriented to person, place, and time.  Psychiatric:        Mood and Affect: Mood normal.        Behavior: Behavior normal.        Thought Content: Thought content normal.        Judgment: Judgment normal.     BP 121/80 (BP Location: Left Arm)   Pulse 71   Resp 16   Ht 5\' 10"  (1.778 m)   Wt 222 lb 12.8 oz (101.1 kg)   LMP 02/16/2016 (Approximate)   BMI 31.97 kg/m   Past Medical History:  Diagnosis Date   Abnormal Pap smear of cervix     Social History   Socioeconomic History   Marital status: Married    Spouse name: Not on file   Number of children: Not on file   Years of education: Not on  file   Highest education level: Not on file  Occupational History   Not on file  Tobacco Use   Smoking status: Every Day   Smokeless tobacco: Never  Vaping Use   Vaping Use: Never used  Substance and Sexual Activity   Alcohol use: No   Drug use: No   Sexual activity: Yes    Birth control/protection: Surgical  Other Topics Concern   Not on file  Social History Narrative   Not on file   Social Determinants of Health   Financial Resource Strain: Not on file  Food Insecurity: Not on file  Transportation Needs: Not on file  Physical Activity: Not on file  Stress: Not on file  Social Connections: Not on file  Intimate Partner Violence: Not on file    Past Surgical History:  Procedure Laterality Date   BREAST BIOPSY Left 2010   Dr  Myer Haff per pt   CERVICAL BIOPSY  W/ LOOP ELECTRODE EXCISION     CESAREAN SECTION     COLONOSCOPY WITH PROPOFOL N/A 02/15/2017   Procedure: COLONOSCOPY WITH PROPOFOL;  Surgeon: Pasty Spillers, MD;  Location: [facility] ENDOSCOPY;  Service: Endoscopy;  Laterality: N/A;   CRYOTHERAPY     DILATION AND CURETTAGE OF UTERUS     KNEE ARTHROSCOPY Left    MOHS SURGERY     laser removal of melanoma right shoulder    Family History  Problem Relation Age of Onset   Diabetes Mother    Heart attack Father    Liver disease Sister    Pancreatitis Paternal Grandmother    Breast cancer Neg Hx     Allergies  Allergen Reactions   Codeine         No data to display            CMP  No results found for: "NA", "K", "CL", "CO2", "GLUCOSE", "BUN", "CREATININE", "CALCIUM", "PROT", "ALBUMIN", "AST", "ALT", "ALKPHOS", "BILITOT", "GFR", "EGFR", "GFRNONAA"   No results found.     Assessment & Plan:   1. Varicose veins of right lower extremity with pain Recommend  I have reviewed my previous  discussion with the patient regarding  varicose veins and why they cause symptoms. Patient will continue  wearing graduated compression stockings class 1 on a daily basis, beginning first thing in the morning and removing them in the evening.  The patient is CEAP C3sEpAsPr.  The patient has been wearing compression for more than 12 weeks with no or little benefit.  The patient has been exercising daily for more than 12 weeks. The patient has been elevating and taking OTC pain medications for more than 12 weeks.  None of these have have eliminated the pain related to the varicose veins and venous reflux or the discomfort regarding venous congestion.    In addition, behavioral modification including elevation during the day was again discussed and this will continue.  The patient has utilized over the counter pain medications and has been exercising.  However, at this time conservative therapy has  not alleviated the patient's symptoms of leg pain and swelling  Recommend: laser ablation of the right and  left great saphenous veins to eliminate the symptoms of pain and swelling of the lower extremities caused by the severe superficial venous reflux disease.    No current outpatient medications on file prior to visit.   No current facility-administered medications on file prior to visit.    There are no Patient Instructions on file for this visit. No follow-ups  on file.   Kris Hartmann, NP

## 2022-12-04 ENCOUNTER — Other Ambulatory Visit (INDEPENDENT_AMBULATORY_CARE_PROVIDER_SITE_OTHER): Payer: Self-pay

## 2022-12-04 MED ORDER — ALPRAZOLAM 0.5 MG PO TABS: ORAL_TABLET | ORAL | 0 refills | Status: DC

## 2023-03-04 ENCOUNTER — Ambulatory Visit (INDEPENDENT_AMBULATORY_CARE_PROVIDER_SITE_OTHER): Payer: BC Managed Care – PPO | Admitting: Vascular Surgery

## 2023-03-04 ENCOUNTER — Telehealth (INDEPENDENT_AMBULATORY_CARE_PROVIDER_SITE_OTHER): Payer: Self-pay

## 2023-03-04 VITALS — BP 113/79 | HR 87 | Resp 16 | Wt 208.0 lb

## 2023-03-04 DIAGNOSIS — I83819 Varicose veins of unspecified lower extremities with pain: Secondary | ICD-10-CM

## 2023-03-04 DIAGNOSIS — I83811 Varicose veins of right lower extremities with pain: Secondary | ICD-10-CM | POA: Diagnosis not present

## 2023-03-04 NOTE — Telephone Encounter (Signed)
Patient had called earlier regarding her prescription for Xanax and that the pharmacy doesn't have it. I called back after finding out that it is called in and the pharmacy doesn't fill the medication until 1-2 days before the procedure and the patient has to request it. Patient has her medication.

## 2023-03-07 ENCOUNTER — Encounter (INDEPENDENT_AMBULATORY_CARE_PROVIDER_SITE_OTHER): Payer: Self-pay | Admitting: Vascular Surgery

## 2023-03-07 ENCOUNTER — Other Ambulatory Visit (INDEPENDENT_AMBULATORY_CARE_PROVIDER_SITE_OTHER): Payer: Self-pay | Admitting: Vascular Surgery

## 2023-03-07 DIAGNOSIS — I83811 Varicose veins of right lower extremities with pain: Secondary | ICD-10-CM

## 2023-03-08 ENCOUNTER — Encounter (INDEPENDENT_AMBULATORY_CARE_PROVIDER_SITE_OTHER): Payer: Self-pay | Admitting: Vascular Surgery

## 2023-03-08 DIAGNOSIS — I83819 Varicose veins of unspecified lower extremities with pain: Secondary | ICD-10-CM | POA: Insufficient documentation

## 2023-03-11 ENCOUNTER — Other Ambulatory Visit (INDEPENDENT_AMBULATORY_CARE_PROVIDER_SITE_OTHER): Payer: BC Managed Care – PPO

## 2023-03-11 DIAGNOSIS — I83811 Varicose veins of right lower extremities with pain: Secondary | ICD-10-CM

## 2023-03-28 NOTE — Progress Notes (Signed)
MRN : [id number]  Sandra Harper is a 53 y.o. (Jan 31, 1971) female who presents with chief complaint of varicose veins hurt.  History of Present Illness:   The patient returns to the office for followup status post laser ablation of the right saphenous vein on 03/04/2023.  The patient note significant improvement in the lower extremity pain but not resolution of the symptoms. The patient notes multiple residual varicosities bilaterally which continued to hurt with dependent positions and remained tender to palpation. The patient's swelling is minimally from preoperative status. The patient continues to wear graduated compression stockings on a daily basis but these are not eliminating the pain and discomfort. The patient continues to use over-the-counter anti-inflammatory medications to treat the pain and related symptoms but this has not given the patient relief. The patient notes the pain in the lower extremities is causing problems with daily exercise, problems at work and even with household activities such as preparing meals and doing dishes.  The patient is otherwise done well and there have been no complications related to the laser procedure or interval changes in the patient's overall   Post laser ultrasound shows successful ablation of the right saphenous vein   No outpatient medications have been marked as taking for the 04/01/23 encounter (Appointment) with Gilda Crease, Latina Craver, MD.    Past Medical History:  Diagnosis Date   Abnormal Pap smear of cervix     Past Surgical History:  Procedure Laterality Date   BREAST BIOPSY Left 2010   Dr Myer Haff per pt   CERVICAL BIOPSY  W/ LOOP ELECTRODE EXCISION     CESAREAN SECTION     COLONOSCOPY WITH PROPOFOL N/A 02/15/2017   Procedure: COLONOSCOPY WITH PROPOFOL;  Surgeon: Pasty Spillers, MD;  Location: [facility] ENDOSCOPY;  Service: Endoscopy;  Laterality: N/A;   CRYOTHERAPY     DILATION AND CURETTAGE OF UTERUS      KNEE ARTHROSCOPY Left    MOHS SURGERY     laser removal of melanoma right shoulder    Social History Social History   Tobacco Use   Smoking status: Every Day   Smokeless tobacco: Never  Vaping Use   Vaping status: Never Used  Substance Use Topics   Alcohol use: No   Drug use: No    Family History Family History  Problem Relation Age of Onset   Diabetes Mother    Heart attack Father    Liver disease Sister    Pancreatitis Paternal Grandmother    Breast cancer Neg Hx     Allergies  Allergen Reactions   Codeine      REVIEW OF SYSTEMS (Negative unless checked)  Constitutional: [] Weight loss  [] Fever  [] Chills Cardiac: [] Chest pain   [] Chest pressure   [] Palpitations   [] Shortness of breath when laying flat   [] Shortness of breath with exertion. Vascular:  [] Pain in legs with walking   [x] Pain in legs with standing  [] History of DVT   [] Phlebitis   [] Swelling in legs   [x] Varicose veins   [] Non-healing ulcers Pulmonary:   [] Uses home oxygen   [] Productive cough   [] Hemoptysis   [] Wheeze  [] COPD   [] Asthma Neurologic:  [] Dizziness   [] Seizures   [] History of stroke   [] History of TIA  [] Aphasia   [] Vissual changes   [] Weakness or numbness in arm   [] Weakness or numbness in leg Musculoskeletal:   [] Joint swelling   [] Joint pain   []   Low back pain Hematologic:  [] Easy bruising  [] Easy bleeding   [] Hypercoagulable state   [] Anemic Gastrointestinal:  [] Diarrhea   [] Vomiting  [] Gastroesophageal reflux/heartburn   [] Difficulty swallowing. Genitourinary:  [] Chronic kidney disease   [] Difficult urination  [] Frequent urination   [] Blood in urine Skin:  [] Rashes   [] Ulcers  Psychological:  [] History of anxiety   []  History of major depression.  Physical Examination  There were no vitals filed for this visit. There is no height or weight on file to calculate BMI. Gen: WD/WN, NAD Head: [place]/AT, No temporalis wasting.  Ear/Nose/Throat: Hearing grossly intact, nares w/o erythema or  drainage, pinna without lesions Eyes: PER, EOMI, sclera nonicteric.  Neck: Supple, no gross masses.  No JVD.  Pulmonary:  Good air movement, no audible wheezing, no use of accessory muscles.  Cardiac: RRR, precordium not hyperdynamic. Vascular:  Large varicosities present, greater than 10 mm right lower extremity.  Veins are tender to palpation  Mild venous stasis changes to the legs bilaterally.  Trace soft pitting edema CEAP C3sEpAsPr Vessel Right Left  Radial Palpable Palpable  Gastrointestinal: soft, non-distended. No guarding/no peritoneal signs.  Musculoskeletal: M/S 5/5 throughout.  No deformity.  Neurologic: CN 2-12 intact. Pain and light touch intact in extremities.  Symmetrical.  Speech is fluent. Motor exam as listed above. Psychiatric: Judgment intact, Mood & affect appropriate for pt's clinical situation. Dermatologic: Venous rashes no ulcers noted.  No changes consistent with cellulitis. Lymph : No lichenification or skin changes of chronic lymphedema.  CBC No results found for: "WBC", "HGB", "HCT", "MCV", "PLT"  BMET No results found for: "NA", "K", "CL", "CO2", "GLUCOSE", "BUN", "CREATININE", "CALCIUM", "GFRNONAA", "GFRAA" CrCl cannot be calculated (No successful lab value found.).  COAG No results found for: "INR", "PROTIME"  Radiology VAS [place] LOWER EXTREMITY VENOUS POST ABLATION Result Date: 03/11/2023  Lower Venous DVT Study Patient Name:  Sandra Harper  Date of Exam:   03/11/2023 Medical Rec #: [id number]     Accession #:    [id number] Date of Birth: 01-09-1971     Patient Gender: F Patient Age:   27 years Exam Location:  [facility] Procedure:      VAS [place] LOWER EXTREMITY VENOUS (DVT) Referring Phys: Levora Dredge --------------------------------------------------------------------------------  Indications: Follow up post EVLT, and Pain.  Performing Technologist: Hardie Lora RVT  Examination Guidelines: A complete evaluation includes B-mode imaging, spectral  Doppler, color Doppler, and power Doppler as needed of all accessible portions of each vessel. Bilateral testing is considered an integral part of a complete examination. Limited examinations for reoccurring indications may be performed as noted. The reflux portion of the exam is performed with the patient in reverse Trendelenburg.  Post Ablation: Follow up evaluation status post ablation of right Anterior Accessory Great Saphenous Vein on 03/04/2023.  +---------+---------------+---------+-----------+----------+--------------+ RIGHT    CompressibilityPhasicitySpontaneityPropertiesThrombus Aging +---------+---------------+---------+-----------+----------+--------------+ CFV      Full                                                        +---------+---------------+---------+-----------+----------+--------------+ FV Prox  Full                                                        +---------+---------------+---------+-----------+----------+--------------+  FV Mid   Full                                                        +---------+---------------+---------+-----------+----------+--------------+ FV DistalFull                                                        +---------+---------------+---------+-----------+----------+--------------+ GSV      Full                                                        +---------+---------------+---------+-----------+----------+--------------+ AAGSV    None           No       No         dilated                  +---------+---------------+---------+-----------+----------+--------------+   +----+---------------+---------+-----------+----------+--------------+ LEFTCompressibilityPhasicitySpontaneityPropertiesThrombus Aging +----+---------------+---------+-----------+----------+--------------+ CFV Full           Yes      Yes                                  +----+---------------+---------+-----------+----------+--------------+     Summary: RIGHT: - There is no evidence of deep vein thrombosis in the lower extremity.  - Successful ablation of right anterior accessory GSV.  LEFT: - No evidence of common femoral vein obstruction.   Post Ablation Summary: - No evidence of deep vein thrombosis from the right common femoral through the popliteal veins. - Successful ablation of the right Anterior Accessory Great Saphenous Vein from the SFJ to Mid thigh.  *See table(s) above for measurements and observations. Electronically signed by Levora Dredge MD on 03/11/2023 at 10:49:19 AM.    Final      Assessment/Plan 1. Varicose veins of lower extremity with pain, unspecified laterality (Primary) Recommend:  The patient has had successful ablation of the previously incompetent saphenous venous system but still has persistent symptoms of pain and swelling that are having a negative impact on daily life and daily activities.  Patient should undergo injection sclerotherapy to treat the residual varicosities.  The risks, benefits and alternative therapies were reviewed in detail with the patient.  All questions were answered.  The patient agrees to proceed with sclerotherapy at their convenience.  The patient will continue wearing the graduated compression stockings and using the over-the-counter pain medications to treat her symptoms.   2. Arthritis of left knee Continue medications to treat the patient's degenerative disease as already ordered, these medications have been reviewed and there are no changes at this time.  Continued activity and therapy was stressed.   Levora Dredge, MD  03/28/2023 2:56 PM

## 2023-04-01 ENCOUNTER — Encounter (INDEPENDENT_AMBULATORY_CARE_PROVIDER_SITE_OTHER): Payer: Self-pay | Admitting: Vascular Surgery

## 2023-04-01 ENCOUNTER — Ambulatory Visit (INDEPENDENT_AMBULATORY_CARE_PROVIDER_SITE_OTHER): Payer: BC Managed Care – PPO | Admitting: Vascular Surgery

## 2023-04-01 VITALS — BP 123/86 | HR 84 | Resp 18 | Ht 70.0 in | Wt 208.6 lb

## 2023-04-01 DIAGNOSIS — I83819 Varicose veins of unspecified lower extremities with pain: Secondary | ICD-10-CM

## 2023-04-01 DIAGNOSIS — M1712 Unilateral primary osteoarthritis, left knee: Secondary | ICD-10-CM

## 2023-05-31 ENCOUNTER — Telehealth (INDEPENDENT_AMBULATORY_CARE_PROVIDER_SITE_OTHER): Payer: Self-pay | Admitting: Nurse Practitioner

## 2023-05-31 NOTE — Telephone Encounter (Signed)
 LVM for pt TCB and schedule appt X 3  Right leg SALINE sclero. See fb. Auth # [id number] exp: 3.25.25 - 9.25.25 - 3 units total

## 2023-07-16 ENCOUNTER — Ambulatory Visit (INDEPENDENT_AMBULATORY_CARE_PROVIDER_SITE_OTHER): Admitting: Nurse Practitioner

## 2023-07-23 ENCOUNTER — Encounter (INDEPENDENT_AMBULATORY_CARE_PROVIDER_SITE_OTHER): Payer: Self-pay

## 2023-08-13 ENCOUNTER — Ambulatory Visit (INDEPENDENT_AMBULATORY_CARE_PROVIDER_SITE_OTHER): Admitting: Nurse Practitioner

## 2023-09-10 ENCOUNTER — Ambulatory Visit (INDEPENDENT_AMBULATORY_CARE_PROVIDER_SITE_OTHER): Admitting: Nurse Practitioner

## 2023-09-10 ENCOUNTER — Encounter (INDEPENDENT_AMBULATORY_CARE_PROVIDER_SITE_OTHER): Payer: Self-pay | Admitting: Nurse Practitioner

## 2023-09-10 VITALS — BP 121/83 | HR 90 | Ht 70.0 in | Wt 225.1 lb

## 2023-09-10 DIAGNOSIS — I83811 Varicose veins of right lower extremities with pain: Secondary | ICD-10-CM | POA: Diagnosis not present

## 2023-09-10 NOTE — Progress Notes (Signed)
 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   Procedure: Sclerotherapy using hypertonic saline mixed with 1% Lidocaine was performed on the right lower extremity. Compression wraps were placed. The patient tolerated the procedure well.

## 2023-10-08 ENCOUNTER — Ambulatory Visit (INDEPENDENT_AMBULATORY_CARE_PROVIDER_SITE_OTHER): Admitting: Nurse Practitioner

## 2023-10-08 ENCOUNTER — Encounter (INDEPENDENT_AMBULATORY_CARE_PROVIDER_SITE_OTHER): Payer: Self-pay | Admitting: Nurse Practitioner

## 2023-10-08 VITALS — BP 119/84 | HR 76 | Resp 18 | Wt 224.0 lb

## 2023-10-08 DIAGNOSIS — I83811 Varicose veins of right lower extremities with pain: Secondary | ICD-10-CM | POA: Diagnosis not present

## 2023-10-09 ENCOUNTER — Encounter (INDEPENDENT_AMBULATORY_CARE_PROVIDER_SITE_OTHER): Payer: Self-pay | Admitting: Nurse Practitioner

## 2023-10-09 NOTE — Progress Notes (Signed)
 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   Procedure: Sclerotherapy using hypertonic saline mixed with 1% Lidocaine was performed on the right lower extremity. Compression wraps were placed. The patient tolerated the procedure well.

## 2023-11-05 ENCOUNTER — Encounter (INDEPENDENT_AMBULATORY_CARE_PROVIDER_SITE_OTHER): Payer: Self-pay | Admitting: Nurse Practitioner

## 2023-11-05 ENCOUNTER — Ambulatory Visit (INDEPENDENT_AMBULATORY_CARE_PROVIDER_SITE_OTHER): Admitting: Nurse Practitioner

## 2023-11-05 VITALS — BP 118/74 | HR 92 | Ht 70.0 in | Wt 229.2 lb

## 2023-11-05 DIAGNOSIS — I83811 Varicose veins of right lower extremities with pain: Secondary | ICD-10-CM | POA: Diagnosis not present

## 2023-11-05 NOTE — Progress Notes (Signed)
 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   Procedure: Sclerotherapy using hypertonic saline mixed with 1% Lidocaine was performed on the right lower extremity. Compression wraps were placed. The patient tolerated the procedure well.

## 2023-12-19 ENCOUNTER — Ambulatory Visit (INDEPENDENT_AMBULATORY_CARE_PROVIDER_SITE_OTHER): Admitting: Vascular Surgery

## 2023-12-23 ENCOUNTER — Ambulatory Visit (INDEPENDENT_AMBULATORY_CARE_PROVIDER_SITE_OTHER): Admitting: Nurse Practitioner
